# Patient Record
Sex: Female | Born: 1995 | Hispanic: Yes | Marital: Married | Smoking: Never smoker
Health system: Southern US, Community
[De-identification: ages and names within clinical notes are randomized; demographics above are authoritative.]

## PROBLEM LIST (undated history)

## (undated) DIAGNOSIS — D649 Anemia, unspecified: Secondary | ICD-10-CM

## (undated) DIAGNOSIS — E119 Type 2 diabetes mellitus without complications: Secondary | ICD-10-CM

## (undated) HISTORY — DX: Anemia, unspecified: D64.9

## (undated) HISTORY — PX: WISDOM TOOTH EXTRACTION: SHX21

## (undated) HISTORY — DX: Type 2 diabetes mellitus without complications: E11.9

## (undated) HISTORY — PX: SPINE SURGERY: SHX786

---
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
## 2014-08-12 DIAGNOSIS — M545 Low back pain: Secondary | ICD-10-CM | POA: Insufficient documentation

## 2014-08-12 DIAGNOSIS — Z720 Tobacco use: Secondary | ICD-10-CM | POA: Diagnosis not present

## 2014-08-12 DIAGNOSIS — R509 Fever, unspecified: Secondary | ICD-10-CM | POA: Insufficient documentation

## 2014-08-13 ENCOUNTER — Emergency Department (HOSPITAL_COMMUNITY)
Admission: EM | Admit: 2014-08-13 | Discharge: 2014-08-13 | Disposition: A | Discharge status: Home / Self Care | Payer: Medicaid Other | Attending: Emergency Medicine | Admitting: Emergency Medicine

## 2014-08-13 ENCOUNTER — Encounter (HOSPITAL_COMMUNITY): Payer: Self-pay | Admitting: Emergency Medicine

## 2014-08-13 DIAGNOSIS — M545 Low back pain, unspecified: Secondary | ICD-10-CM

## 2014-08-13 LAB — URINALYSIS, ROUTINE W REFLEX MICROSCOPIC
BILIRUBIN URINE: NEGATIVE
Glucose, UA: NEGATIVE mg/dL
Hgb urine dipstick: NEGATIVE
KETONES UR: NEGATIVE mg/dL
Leukocytes, UA: NEGATIVE
NITRITE: NEGATIVE
Protein, ur: NEGATIVE mg/dL
Specific Gravity, Urine: 1.014 (ref 1.005–1.030)
Urobilinogen, UA: 0.2 mg/dL (ref 0.0–1.0)
pH: 5.5 (ref 5.0–8.0)

## 2014-08-13 LAB — CBC
HEMATOCRIT: 35.4 % — AB (ref 36.0–46.0)
HEMOGLOBIN: 11.2 g/dL — AB (ref 12.0–15.0)
MCH: 24.9 pg — ABNORMAL LOW (ref 26.0–34.0)
MCHC: 31.6 g/dL (ref 30.0–36.0)
MCV: 78.8 fL (ref 78.0–100.0)
Platelets: 285 10*3/uL (ref 150–400)
RBC: 4.49 MIL/uL (ref 3.87–5.11)
RDW: 17.6 % — ABNORMAL HIGH (ref 11.5–15.5)
WBC: 10.2 10*3/uL (ref 4.0–10.5)

## 2014-08-13 MED ORDER — TRAMADOL HCL 50 MG PO TABS: 50.0000 mg | ORAL_TABLET | Freq: Four times a day (QID) | ORAL | Status: DC | PRN

## 2014-08-13 MED ORDER — CYCLOBENZAPRINE HCL 10 MG PO TABS
5.0000 mg | ORAL_TABLET | Freq: Once | ORAL | Status: AC
  Administered 2014-08-13: 5 mg via ORAL
  Filled 2014-08-13: qty 1

## 2014-08-13 MED ORDER — CYCLOBENZAPRINE HCL 5 MG PO TABS: 5.0000 mg | ORAL_TABLET | Freq: Three times a day (TID) | ORAL | Status: DC | PRN

## 2014-08-13 MED ORDER — TRAMADOL HCL 50 MG PO TABS
50.0000 mg | ORAL_TABLET | Freq: Once | ORAL | Status: AC
  Administered 2014-08-13: 50 mg via ORAL
  Filled 2014-08-13: qty 1

## 2014-08-13 NOTE — ED Notes (Signed)
Patient here with lower back pain which is worsened by sitting and laying down. This pain is accompanied by a headache. States history of migraines in the presence of anemia.

## 2014-08-13 NOTE — ED Provider Notes (Signed)
CSN: [id number]     Arrival date & time 08/12/14  2352 History   First MD Initiated Contact with Patient 08/13/14 0038     Chief Complaint  Patient presents with  . Headache  . Back Pain     (Consider location/radiation/quality/duration/timing/severity/associated sxs/prior Treatment) HPI Comments: Patient reports that she's been between jobs for the past 2 weeks, doing housework.  She presents with one week of lumbosacral pain that does not radiate to her legs.  There is no numbness or tingling.  No loss of bowel or bladder control.  Denies any dysuria, diarrhea, constipation.  She states she has taken Advil occasionally for her discomfort without relief  Patient is a 19 y.o. female presenting with back pain. The history is provided by the patient.  Back Pain Location:  Lumbar spine Quality:  Aching Radiates to:  Does not radiate Pain severity:  Mild Pain is:  Same all the time Timing:  Constant Progression:  Unchanged Chronicity:  New Relieved by:  Nothing Worsened by:  Sitting and lying down Ineffective treatments:  None tried Associated symptoms: fever   Associated symptoms: no abdominal pain, no dysuria, no headaches, no leg pain, no numbness, no paresthesias, no perianal numbness, no tingling and no weakness     History reviewed. No pertinent past medical history. History reviewed. No pertinent past surgical history. No family history on file. History  Substance Use Topics  . Smoking status: Current Some Day Smoker  . Smokeless tobacco: Not on file  . Alcohol Use: No   OB History    No data available     Review of Systems  Constitutional: Positive for fever.  Respiratory: Negative for cough and shortness of breath.   Gastrointestinal: Negative for nausea, vomiting, abdominal pain, diarrhea and constipation.  Genitourinary: Negative for dysuria, decreased urine volume, vaginal bleeding and vaginal discharge.  Musculoskeletal: Positive for back pain.  Skin:  Negative for rash.  Neurological: Negative for tingling, weakness, numbness, headaches and paresthesias.  All other systems reviewed and are negative.     Allergies  Review of patient's allergies indicates no known allergies.  Home Medications   Prior to Admission medications   Medication Sig Start Date End Date Taking? Authorizing Provider  cyclobenzaprine (FLEXERIL) 5 MG tablet Take 1 tablet (5 mg total) by mouth 3 (three) times daily as needed for muscle spasms. 08/13/14   Earley FavorGail Danniel Grenz, NP  traMADol (ULTRAM) 50 MG tablet Take 1 tablet (50 mg total) by mouth every 6 (six) hours as needed. 08/13/14   Earley FavorGail Ambur Province, NP   BP 121/79 mmHg  Pulse 92  Temp(Src) 98.3 F (36.8 C)  Resp 20  Wt 241 lb 3 oz (109.402 kg)  SpO2 100%  LMP 07/26/2014 (Exact Date) Physical Exam  Constitutional: She appears well-developed and well-nourished.  HENT:  Head: Normocephalic.  Neck: Normal range of motion.  Cardiovascular: Normal rate and regular rhythm.   Pulmonary/Chest: Effort normal.  Abdominal: Soft.  Musculoskeletal: Normal range of motion. She exhibits tenderness.       Back:  Neurological: She is alert.  Skin: Skin is warm.  Nursing note and vitals reviewed.   ED Course  Procedures (including critical care time) Labs Review Labs Reviewed  CBC - Abnormal; Notable for the following:    Hemoglobin 11.2 (*)    HCT 35.4 (*)    MCH 24.9 (*)    RDW 17.6 (*)    All other components within normal limits  URINALYSIS, ROUTINE W REFLEX MICROSCOPIC  Imaging Review No results found.   EKG Interpretation None     Urine was checked, is negative for any sign of infection.  She's been given a prescription for Ultram, and Flexeril to take and a regular basis.  She's also been given a set of exercises to help strengthen her lower back MDM   Final diagnoses:  Lumbosacral pain         Earley Favor, NP 08/13/14 0231  Derwood Kaplan, MD 08/13/14 [phone]

## 2014-08-13 NOTE — Discharge Instructions (Signed)
Back Exercises Back exercises help treat and prevent back injuries. The goal of back exercises is to increase the strength of your abdominal and back muscles and the flexibility of your back. These exercises should be started when you no longer have back pain. Back exercises include:  Pelvic Tilt. Lie on your back with your knees bent. Tilt your pelvis until the lower part of your back is against the floor. Hold this position 5 to 10 sec and repeat 5 to 10 times.  Knee to Chest. Pull first 1 knee up against your chest and hold for 20 to 30 seconds, repeat this with the other knee, and then both knees. This may be done with the other leg straight or bent, whichever feels better.  Sit-Ups or Curl-Ups. Bend your knees 90 degrees. Start with tilting your pelvis, and do a partial, slow sit-up, lifting your trunk only 30 to 45 degrees off the floor. Take at least 2 to 3 seconds for each sit-up. Do not do sit-ups with your knees out straight. If partial sit-ups are difficult, simply do the above but with only tightening your abdominal muscles and holding it as directed.  Hip-Lift. Lie on your back with your knees flexed 90 degrees. Push down with your feet and shoulders as you raise your hips a couple inches off the floor; hold for 10 seconds, repeat 5 to 10 times.  Back arches. Lie on your stomach, propping yourself up on bent elbows. Slowly press on your hands, causing an arch in your low back. Repeat 3 to 5 times. Any initial stiffness and discomfort should lessen with repetition over time.  Shoulder-Lifts. Lie face down with arms beside your body. Keep hips and torso pressed to floor as you slowly lift your head and shoulders off the floor. Do not overdo your exercises, especially in the beginning. Exercises may cause you some mild back discomfort which lasts for a few minutes; however, if the pain is more severe, or lasts for more than 15 minutes, do not continue exercises until you see your caregiver.  Improvement with exercise therapy for back problems is slow.  See your caregivers for assistance with developing a proper back exercise program. Document Released: 06/07/2004 Document Revised: 07/23/2011 Document Reviewed: 03/01/2011 [facility] LP[facility] Patient Information 2015 [place] [employer]. This information is not intended to replace advice given to you by your health care provider. Make sure you discuss any questions you have with your health care provider. Review urine is normal.  You have no other signs of infection.  You've been given medication for pain as well as muscle relaxers.  You've also been given a set of exercises that you can perform to help strengthen your back muscles

## 2015-12-12 ENCOUNTER — Other Ambulatory Visit: Payer: Self-pay | Admitting: Family Medicine

## 2015-12-12 ENCOUNTER — Ambulatory Visit (INDEPENDENT_AMBULATORY_CARE_PROVIDER_SITE_OTHER): Payer: Self-pay | Admitting: Family Medicine

## 2015-12-12 VITALS — BP 138/82 | HR 79 | Temp 98.1°F | Resp 17 | Ht 67.0 in | Wt 228.0 lb

## 2015-12-12 DIAGNOSIS — R197 Diarrhea, unspecified: Secondary | ICD-10-CM

## 2015-12-12 DIAGNOSIS — R109 Unspecified abdominal pain: Secondary | ICD-10-CM

## 2015-12-12 DIAGNOSIS — D649 Anemia, unspecified: Secondary | ICD-10-CM

## 2015-12-12 LAB — POCT URINALYSIS DIP (MANUAL ENTRY)
Blood, UA: NEGATIVE
GLUCOSE UA: NEGATIVE
LEUKOCYTES UA: NEGATIVE
NITRITE UA: NEGATIVE
Protein Ur, POC: 100 — AB
SPEC GRAV UA: 1.025
UROBILINOGEN UA: 0.2
pH, UA: 5.5

## 2015-12-12 LAB — POCT CBC
GRANULOCYTE PERCENT: 55.6 % (ref 37–80)
HEMATOCRIT: 34.8 % — AB (ref 37.7–47.9)
Hemoglobin: 11.8 g/dL — AB (ref 12.2–16.2)
Lymph, poc: 2.8 (ref 0.6–3.4)
MCH: 26.2 pg — AB (ref 27–31.2)
MCHC: 33.8 g/dL (ref 31.8–35.4)
MCV: 77.6 fL — AB (ref 80–97)
MID (cbc): 0.5 (ref 0–0.9)
MPV: 7.9 fL (ref 0–99.8)
PLATELET COUNT, POC: 286 10*3/uL (ref 142–424)
POC GRANULOCYTE: 4.2 (ref 2–6.9)
POC LYMPH %: 37.3 % (ref 10–50)
POC MID %: 7.1 %M (ref 0–12)
RBC: 4.48 M/uL (ref 4.04–5.48)
RDW, POC: 16.4 %
WBC: 7.5 10*3/uL (ref 4.6–10.2)

## 2015-12-12 LAB — POC MICROSCOPIC URINALYSIS (UMFC): Mucus: ABSENT

## 2015-12-12 LAB — POCT URINE PREGNANCY: PREG TEST UR: NEGATIVE

## 2015-12-12 MED ORDER — DIPHENOXYLATE-ATROPINE 2.5-0.025 MG PO TABS: 2.0000 | ORAL_TABLET | Freq: Four times a day (QID) | ORAL | 0 refills | Status: DC | PRN

## 2015-12-12 MED ORDER — FERROUS GLUCONATE 324 (38 FE) MG PO TABS: 324.0000 mg | ORAL_TABLET | Freq: Every day | ORAL | 3 refills | Status: DC

## 2015-12-12 MED ORDER — ONDANSETRON 8 MG PO TBDP: 8.0000 mg | ORAL_TABLET | Freq: Three times a day (TID) | ORAL | 0 refills | Status: DC | PRN

## 2015-12-12 NOTE — Patient Instructions (Addendum)
Will follow-up with lab results.  Begin taking daily iron if able tolerate daily with breakfast.  Zofran-take as needed for nausea.  Continue to force fluids to prevent dehydration.    IF you received an x-ray today, you will receive an invoice from [facility]. Please contact [facility] at [phone] with questions or concerns regarding your invoice.   IF you received labwork today, you will receive an invoice from [employer]. Please contact [facility] at [phone] with questions or concerns regarding your invoice.   Our billing staff will not be able to assist you with questions regarding bills from these companies.  You will be contacted with the lab results as soon as they are available. The fastest way to get your results is to activate your My Chart account. Instructions are located on the last page of this paperwork. If you have not heard from [place] regarding the results in 2 weeks, please contact this office.

## 2015-12-12 NOTE — Progress Notes (Signed)
Patient ID: Aimee [name], female    DOB: 1996-01-22, 20 y.o.   MRN: [id number]  PCP: No primary care provider on file.  Chief Complaint  Patient presents with  . Nausea  . Diarrhea  . Abdominal Pain    upper and lower  . Spasms    All started wed.     Subjective:   HPI Presents for evaluation of nausea and abdominal pain since last Wednesday, x 6 days. No recent known contributing exposures. Really strong abdominal pains and diarrhea. Tried over the counter anti-diarrheal medications without relief of symptoms. Felt some dizziness intermittently. Cramping experienced in legs and toes intermittently since onset of diarrhea.No fever or chills. Denies possibility of pregnancy. Admits to unprotected sex a few months ago. Stools occur almost immediately after completing a meal. She has been able to tolerate fluids. She was thinking a contributor to diarrhea could be cheese imported from [place] that she consumed last week. Does not recall a reaction from eating diary products in the past.  . Social History   Social History  . Marital status: Single    Spouse name: N/A  . Number of children: N/A  . Years of education: N/A   Occupational History  . Not on file.   Social History Main Topics  . Smoking status: Former Games developer  . Smokeless tobacco: Not on file  . Alcohol use No  . Drug use: Unknown  . Sexual activity: Yes    Birth control/ protection: None   Other Topics Concern  . Not on file   Social History Narrative  . No narrative on file    .No family history on file.  Review of Systems  Constitutional: Negative.   Respiratory: Negative.   Cardiovascular: Negative.   Gastrointestinal: Positive for abdominal pain and diarrhea. Negative for anal bleeding, blood in stool, constipation, nausea and vomiting.  Endocrine: Negative.   Skin: Negative.   Allergic/Immunologic: Negative.   Psychiatric/Behavioral: Negative.        There are no active  problems to display for this patient.    Prior to Admission medications   Medication Sig Start Date End Date Taking? Authorizing Provider  cyclobenzaprine (FLEXERIL) 5 MG tablet Take 1 tablet (5 mg total) by mouth 3 (three) times daily as needed for muscle spasms. Patient not taking: Reported on 12/12/2015 08/13/14   Earley Favor, NP  traMADol (ULTRAM) 50 MG tablet Take 1 tablet (50 mg total) by mouth every 6 (six) hours as needed. Patient not taking: Reported on 12/12/2015 08/13/14   Earley Favor, NP     No Known Allergies     Objective:  Physical Exam  Constitutional: She is oriented to person, place, and time. She appears well-developed and well-nourished.  HENT:  Head: Normocephalic and atraumatic.  Eyes: Conjunctivae and EOM are normal. Pupils are equal, round, and reactive to light.  Neck: Normal range of motion. Neck supple.  Cardiovascular: Normal rate, regular rhythm, normal heart sounds and intact distal pulses.   Pulmonary/Chest: Effort normal and breath sounds normal.  Abdominal: She exhibits no distension. There is tenderness. There is no rebound and no guarding.  Lower bilateral quadrant tenderness with deep palpation  Musculoskeletal: Normal range of motion.  Neurological: She is alert and oriented to person, place, and time.  Skin: Skin is warm and dry.  Psychiatric: She has a normal mood and affect. Her behavior is normal. Judgment and thought content normal.        Assessment & Plan:  .1. Abdominal  pain, unspecified abdominal location - POCT urine pregnancy - POCT Microscopic Urinalysis ([facility]) - POCT urinalysis dipstick - H. pylori antibody, IgG 2. Anemia, unspecified anemia type - POCT CBC 3. Diarrhea, unspecified type -Comprehensive metabolic panel - H. pylori antibody, IgG  Patient presents today with a complaint of abdominal pain with diarrhea times 5 days.  She has no known exposures, afebrile, and absent of nausea/vomiting.  H pylori testing pending.  Likely, viral gastritis.  Take Lomotil 2 tabs as needed for diarrhea.  Will follow-up with lab results. If conditions worsens or does not improve, return to office for follow-up.  Godfrey Pick. Tiburcio Pea, MSN, FNP-C [facility]

## 2015-12-13 LAB — COMPREHENSIVE METABOLIC PANEL
ALBUMIN: 4.2 g/dL (ref 3.6–5.1)
ALK PHOS: 82 U/L (ref 33–115)
ALT: 35 U/L — ABNORMAL HIGH (ref 6–29)
AST: 28 U/L (ref 10–30)
BUN: 8 mg/dL (ref 7–25)
CALCIUM: 9.3 mg/dL (ref 8.6–10.2)
CHLORIDE: 104 mmol/L (ref 98–110)
CO2: 27 mmol/L (ref 20–31)
Creat: 0.71 mg/dL (ref 0.50–1.10)
GLUCOSE: 87 mg/dL (ref 65–99)
Potassium: 3.9 mmol/L (ref 3.5–5.3)
SODIUM: 137 mmol/L (ref 135–146)
Total Bilirubin: 0.3 mg/dL (ref 0.2–1.2)
Total Protein: 7 g/dL (ref 6.1–8.1)

## 2015-12-16 LAB — H. PYLORI BREATH TEST: H. PYLORI BREATH TEST: NOT DETECTED

## 2015-12-21 ENCOUNTER — Encounter: Payer: Self-pay | Admitting: Family Medicine

## 2017-09-10 ENCOUNTER — Encounter: Payer: Self-pay | Admitting: Family Medicine

## 2017-09-10 ENCOUNTER — Ambulatory Visit (INDEPENDENT_AMBULATORY_CARE_PROVIDER_SITE_OTHER): Payer: BLUE CROSS/BLUE SHIELD | Admitting: Family Medicine

## 2017-09-10 ENCOUNTER — Other Ambulatory Visit: Payer: Self-pay

## 2017-09-10 VITALS — BP 128/80 | HR 94 | Temp 98.8°F | Ht 66.0 in | Wt 263.2 lb

## 2017-09-10 DIAGNOSIS — R5383 Other fatigue: Secondary | ICD-10-CM

## 2017-09-10 DIAGNOSIS — N926 Irregular menstruation, unspecified: Secondary | ICD-10-CM | POA: Diagnosis not present

## 2017-09-10 DIAGNOSIS — Z119 Encounter for screening for infectious and parasitic diseases, unspecified: Secondary | ICD-10-CM | POA: Diagnosis not present

## 2017-09-10 DIAGNOSIS — Z01419 Encounter for gynecological examination (general) (routine) without abnormal findings: Secondary | ICD-10-CM

## 2017-09-10 DIAGNOSIS — K219 Gastro-esophageal reflux disease without esophagitis: Secondary | ICD-10-CM

## 2017-09-10 DIAGNOSIS — R0602 Shortness of breath: Secondary | ICD-10-CM

## 2017-09-10 DIAGNOSIS — Z13228 Encounter for screening for other metabolic disorders: Secondary | ICD-10-CM

## 2017-09-10 DIAGNOSIS — R102 Pelvic and perineal pain: Secondary | ICD-10-CM | POA: Diagnosis not present

## 2017-09-10 DIAGNOSIS — G5603 Carpal tunnel syndrome, bilateral upper limbs: Secondary | ICD-10-CM

## 2017-09-10 LAB — POCT URINE PREGNANCY: Preg Test, Ur: NEGATIVE

## 2017-09-10 MED ORDER — OMEPRAZOLE 20 MG PO CPDR: 20.0000 mg | DELAYED_RELEASE_CAPSULE | Freq: Two times a day (BID) | ORAL | 0 refills | Status: DC

## 2017-09-10 NOTE — Progress Notes (Signed)
4/30/20193:29 PM  Aimee [name] [birth year], 22 y.o. female [id number]  Chief Complaint  Patient presents with  . Annual Exam    having swelling in the in feet and tingling in both hands, especially at night. Also, pain in the stomach area    HPI:   Patient is a 22 y.o. female who presents today for CPE with several concerns  Has not seen a doctor in several years Has not had a pap yet, she is 21yo LMP 08/01/17, then had spotting again on 08/15/17 Reports normally has regular cycles, normal flow, 4 days She denies any vaginal discharge, dysuria. Endorses left sided lower abd/pelvic pain for past several weeks Not on birth control, recently married, ok if pregnant Husband currently in [place], pending legal residency Patient returned recently for spending over 6 months in [place] Gained about 30 lbs in this last  year  1. Patient works as a Education administrator, having numbness/tingling of both hands, left worse than right. She is left-handed. No weakness, dropping of objects.  2. She is having worsening heartburn. Mostly at night. Takes tums prn, it helps. No epigastric pain. No nausea, vomiting, diarrhea or constipation  3. Has been having intermittent left ankle swelling, denies any trauma, seems to happen on days when she stands for long periods of time. She wakes up with ankle normal, improves with elevation.   4. Has been noticing intermittent SOB, feels chest tight, no wheezing, no palpitations, no chest pain. Seems to happen with significant exertion. Denies h/o asthma. Does not smoke.  Fall Risk  09/10/2017 12/12/2015  Falls in the past year? No No     Depression screen [facility] 2/9 09/10/2017 12/12/2015  Decreased Interest 0 0  Down, Depressed, Hopeless 0 0  PHQ - 2 Score 0 0    No Known Allergies  Prior to Admission medications   Not on File    History reviewed. No pertinent past medical history.  History reviewed. No pertinent surgical history.  Social History    Tobacco Use  . Smoking status: Former Games developer  . Smokeless tobacco: Never Used  Substance Use Topics  . Alcohol use: No    Family History  Problem Relation Age of Onset  . Healthy Mother   . Diabetes Father   . Diabetes Maternal Grandfather     Review of Systems  Constitutional: Positive for malaise/fatigue. Negative for chills and fever.  HENT: Negative for congestion, ear pain, hearing loss, sinus pain and sore throat.   Eyes: Negative for blurred vision and double vision.  Respiratory: Positive for shortness of breath. Negative for cough, sputum production and wheezing.   Cardiovascular: Negative for chest pain and palpitations.  Gastrointestinal: Positive for heartburn. Negative for abdominal pain, blood in stool, constipation, diarrhea, melena, nausea and vomiting.  Genitourinary: Negative for dysuria, frequency, hematuria and urgency.  Musculoskeletal: Negative for joint pain and myalgias.  Neurological: Positive for dizziness, tingling and headaches. Negative for focal weakness.  Endo/Heme/Allergies: Negative for polydipsia.  Psychiatric/Behavioral: Negative for depression. The patient is not nervous/anxious and does not have insomnia.     OBJECTIVE:  Blood pressure 128/80, pulse 94, temperature 98.8 F (37.1 C), temperature source Oral, height  (1.676 m), weight 263 lb 3.2 oz (119.4 kg), last menstrual period 08/01/2017, SpO2 98 %.  Physical Exam  Constitutional: She is oriented to person, place, and time. She appears well-developed and well-nourished.  HENT:  Head: Normocephalic and atraumatic.  Right Ear: Hearing, tympanic membrane, external ear and ear canal normal.  Left Ear: Hearing, tympanic membrane, external ear and ear canal normal.  Mouth/Throat: Oropharynx is clear and moist.  Eyes: Pupils are equal, round, and reactive to light. Conjunctivae and EOM are normal.  Neck: Neck supple. No thyromegaly present.  Cardiovascular: Normal rate, regular  rhythm, normal heart sounds and intact distal pulses. Exam reveals no gallop and no friction rub.  No murmur heard. Pulmonary/Chest: Effort normal and breath sounds normal. She has no wheezes. She has no rhonchi. She has no rales. Right breast exhibits no inverted nipple, no mass, no nipple discharge, no skin change and no tenderness. Left breast exhibits no inverted nipple, no mass, no nipple discharge, no skin change and no tenderness. Breasts are symmetrical.  Abdominal: Soft. Bowel sounds are normal. She exhibits no distension. There is no hepatosplenomegaly. There is no tenderness. There is no guarding.  Genitourinary: There is no rash or lesion on the right labia. There is no rash or lesion on the left labia. Uterus is not enlarged, not fixed and not tender. Cervix exhibits no motion tenderness, no discharge and no friability. Right adnexum displays no mass and no tenderness. Left adnexum displays tenderness. Left adnexum displays no mass and no fullness. No erythema in the vagina. No vaginal discharge found.  Musculoskeletal: Normal range of motion. She exhibits no edema.  Lymphadenopathy:    She has no cervical adenopathy.    She has no axillary adenopathy.       Right: No supraclavicular adenopathy present.       Left: No supraclavicular adenopathy present.  Neurological: She is alert and oriented to person, place, and time. She has normal strength and normal reflexes. No cranial nerve deficit or sensory deficit. She displays a negative Romberg sign. Coordination and gait normal.  + BUE phalen, postive LUE tinel, neg RUE tinel  Skin: Skin is warm and dry.  Psychiatric: She has a normal mood and affect.  Nursing note and vitals reviewed.  My interpretation of EKG:  NSR, HR 76, normal intervals, no st changes  Results for orders placed or performed in visit on 09/10/17 (from the past 24 hour(s))  POCT urine pregnancy     Status: Normal   Collection Time: 09/10/17  3:50 PM  Result Value  Ref Range   Preg Test, Ur Negative Negative    ASSESSMENT and PLAN  1. Women's annual routine gynecological examination Routine HCM labs ordered. HCM reviewed/discussed. Anticipatory guidance regarding healthy weight, lifestyle and choices given.  - Pap IG, CT/NG w/ reflex HPV when ASC-U  2. Missed period - POCT urine pregnancy  3. Screening for metabolic disorder - Hemoglobin A1c - Lipid panel - TSH - Comprehensive metabolic panel  4. Fatigue, unspecified type 6. SOB (shortness of breath) on exertion Discussed with patient that symptoms might be related to recent weight gain. Labs per below. Normal ekg, further workup at next visit. RTC precautions reviewed.  - CBC with Differential/Platelet - Hemoglobin A1c - TSH - Comprehensive metabolic panel - EKG 12-Lead  5. Bilateral carpal tunnel syndrome Discussed conservative measures, use of braces at night, OTC nsaids, ice. Patient educational handout given.  7. Gastroesophageal reflux disease without esophagitis Discussed recent weight gain as possible trigger, discussed dietary changes, small frequent meals, no late meals. Trial of omeprazole x 1 month. Patient educational handout given. RTC precautions reviewed. - omeprazole (PRILOSEC) 20 MG capsule; Take 1 capsule (20 mg total) by mouth 2 (two) times daily before a meal.  8. Pelvic pain in female Neg preg test. Pap  with gc/cg ordered today. [place] ordered for further evaluations.  - US PELVIC COMPLETE WITH TRANSVAGINAL; Future  9. Screening examination for infectious disease - HIV antibody (with reflex)   Return in about 1 month (around 10/08/2017).    Myles Lipps, MD Primary Care at [facility] [address] [place], [place] [address] Ph.  [phone] Fax [phone]

## 2017-09-10 NOTE — Patient Instructions (Addendum)
IF you received an x-ray today, you will receive an invoice from [facility]. Please contact [facility] at [phone] with questions or concerns regarding your invoice.   IF you received labwork today, you will receive an invoice from [facility]. Please contact [facility] at [phone] with questions or concerns regarding your invoice.   Our billing staff will not be able to assist you with questions regarding bills from these companies.  You will be contacted with the lab results as soon as they are available. The fastest way to get your results is to activate your My Chart account. Instructions are located on the last page of this paperwork. If you have not heard from [place] regarding the results in 2 weeks, please contact this office.     Food Choices for Gastroesophageal Reflux Disease, Adult When you have gastroesophageal reflux disease (GERD), the foods you eat and your eating habits are very important. Choosing the right foods can help ease your discomfort. What guidelines do I need to follow?  Choose fruits, vegetables, whole grains, and low-fat dairy products.  Choose low-fat meat, fish, and poultry.  Limit fats such as oils, salad dressings, butter, nuts, and avocado.  Keep a food diary. This helps you identify foods that cause symptoms.  Avoid foods that cause symptoms. These may be different for everyone.  Eat small meals often instead of 3 large meals a day.  Eat your meals slowly, in a place where you are relaxed.  Limit fried foods.  Cook foods using methods other than frying.  Avoid drinking alcohol.  Avoid drinking large amounts of liquids with your meals.  Avoid bending over or lying down until 2-3 hours after eating. What foods are not recommended? These are some foods and drinks that may make your symptoms worse: Vegetables Tomatoes. Tomato juice. Tomato and spaghetti sauce. Chili peppers. Onion and garlic.  Horseradish. Fruits Oranges, grapefruit, and lemon (fruit and juice). Meats High-fat meats, fish, and poultry. This includes hot dogs, ribs, ham, sausage, salami, and bacon. Dairy Whole milk and chocolate milk. Sour cream. Cream. Butter. Ice cream. Cream cheese. Drinks Coffee and tea. Bubbly (carbonated) drinks or energy drinks. Condiments Hot sauce. Barbecue sauce. Sweets/Desserts Chocolate and cocoa. Donuts. Peppermint and spearmint. Fats and Oils High-fat foods. This includes [place] fries and potato chips. Other Vinegar. Strong spices. This includes black pepper, white pepper, red pepper, cayenne, curry powder, cloves, ginger, and chili powder. The items listed above may not be a complete list of foods and drinks to avoid. Contact your dietitian for more information. This information is not intended to replace advice given to you by your health care provider. Make sure you discuss any questions you have with your health care provider. Document Released: 10/30/2011 Document Revised: 10/06/2015 Document Reviewed: 03/04/2013 Elsevier Interactive Patient Education  2017 Elsevier Inc.  Carpal Tunnel Syndrome Carpal tunnel syndrome is a condition that causes pain in your hand and arm. The carpal tunnel is a narrow area that is on the palm side of your wrist. Repeated wrist motion or certain diseases may cause swelling in the tunnel. This swelling can pinch the main nerve in the wrist (median nerve). Follow these instructions at home: If you have a splint:  Wear it as told by your doctor. Remove it only as told by your doctor.  Loosen the splint if your fingers: ? Become numb and tingle. ? Turn blue and cold.  Keep the splint clean and dry. General instructions  Take over-the-counter and prescription  medicines only as told by your doctor.  Rest your wrist from any activity that may be causing your pain. If needed, talk to your employer about changes that can be made in your work,  such as getting a wrist pad to use while typing.  If directed, apply ice to the painful area: ? Put ice in a plastic bag. ? Place a towel between your skin and the bag. ? Leave the ice on for 20 minutes, 2-3 times per day.  Keep all follow-up visits as told by your doctor. This is important.  Do any exercises as told by your doctor, physical therapist, or occupational therapist. Contact a doctor if:  You have new symptoms.  Medicine does not help your pain.  Your symptoms get worse. This information is not intended to replace advice given to you by your health care provider. Make sure you discuss any questions you have with your health care provider. Document Released: 04/19/2011 Document Revised: 10/06/2015 Document Reviewed: 09/15/2014 Elsevier Interactive Patient Education  [employer].

## 2017-09-11 LAB — CBC WITH DIFFERENTIAL/PLATELET
Basophils Absolute: 0 10*3/uL (ref 0.0–0.2)
Basos: 1 %
EOS (ABSOLUTE): 0.1 10*3/uL (ref 0.0–0.4)
Eos: 2 %
Hematocrit: 37.8 % (ref 34.0–46.6)
Hemoglobin: 12.6 g/dL (ref 11.1–15.9)
Immature Grans (Abs): 0 10*3/uL (ref 0.0–0.1)
Immature Granulocytes: 0 %
Lymphocytes Absolute: 2.6 10*3/uL (ref 0.7–3.1)
Lymphs: 32 %
MCH: 28.3 pg (ref 26.6–33.0)
MCHC: 33.3 g/dL (ref 31.5–35.7)
MCV: 85 fL (ref 79–97)
Monocytes Absolute: 0.7 10*3/uL (ref 0.1–0.9)
Monocytes: 8 %
Neutrophils Absolute: 4.7 10*3/uL (ref 1.4–7.0)
Neutrophils: 57 %
Platelets: 303 10*3/uL (ref 150–379)
RBC: 4.46 x10E6/uL (ref 3.77–5.28)
RDW: 14.6 % (ref 12.3–15.4)
WBC: 8.2 10*3/uL (ref 3.4–10.8)

## 2017-09-11 LAB — LIPID PANEL
Chol/HDL Ratio: 3.6 ratio (ref 0.0–4.4)
Cholesterol, Total: 133 mg/dL (ref 100–199)
HDL: 37 mg/dL — ABNORMAL LOW (ref >39)
LDL Calculated: 72 mg/dL (ref 0–99)
Triglycerides: 119 mg/dL (ref 0–149)
VLDL Cholesterol Cal: 24 mg/dL (ref 5–40)

## 2017-09-11 LAB — COMPREHENSIVE METABOLIC PANEL
ALT: 33 IU/L — ABNORMAL HIGH (ref 0–32)
AST: 25 IU/L (ref 0–40)
Albumin/Globulin Ratio: 1.6 (ref 1.2–2.2)
Albumin: 4.4 g/dL (ref 3.5–5.5)
Alkaline Phosphatase: 77 IU/L (ref 39–117)
BUN/Creatinine Ratio: 13 (ref 9–23)
BUN: 9 mg/dL (ref 6–20)
Bilirubin Total: 0.5 mg/dL (ref 0.0–1.2)
CO2: 25 mmol/L (ref 20–29)
Calcium: 9.5 mg/dL (ref 8.7–10.2)
Chloride: 101 mmol/L (ref 96–106)
Creatinine, Ser: 0.71 mg/dL (ref 0.57–1.00)
GFR calc Af Amer: 140 mL/min/{1.73_m2} (ref >59)
GFR calc non Af Amer: 121 mL/min/{1.73_m2} (ref >59)
Globulin, Total: 2.8 g/dL (ref 1.5–4.5)
Glucose: 89 mg/dL (ref 65–99)
Potassium: 4.5 mmol/L (ref 3.5–5.2)
Sodium: 138 mmol/L (ref 134–144)
Total Protein: 7.2 g/dL (ref 6.0–8.5)

## 2017-09-11 LAB — HEMOGLOBIN A1C
Est. average glucose Bld gHb Est-mCnc: 123 mg/dL
Hgb A1c MFr Bld: 5.9 % — ABNORMAL HIGH (ref 4.8–5.6)

## 2017-09-11 LAB — TSH: TSH: 1.6 u[IU]/mL (ref 0.450–4.500)

## 2017-09-11 LAB — HIV ANTIBODY (ROUTINE TESTING W REFLEX): HIV Screen 4th Generation wRfx: NONREACTIVE

## 2017-09-13 ENCOUNTER — Encounter: Payer: Self-pay | Admitting: Family Medicine

## 2017-09-13 DIAGNOSIS — R87612 Low grade squamous intraepithelial lesion on cytologic smear of cervix (LGSIL): Secondary | ICD-10-CM | POA: Insufficient documentation

## 2017-09-13 LAB — PAP IG, CT-NG, RFX HPV ASCU
Chlamydia, Nuc. Acid Amp: NEGATIVE
Gonococcus by Nucleic Acid Amp: NEGATIVE
PAP Smear Comment: 0

## 2017-10-10 ENCOUNTER — Ambulatory Visit (HOSPITAL_COMMUNITY)
Admission: RE | Admit: 2017-10-10 | Discharge: 2017-10-10 | Disposition: A | Discharge status: Home / Self Care | Payer: BLUE CROSS/BLUE SHIELD | Source: Ambulatory Visit | Attending: Family Medicine | Admitting: Family Medicine

## 2017-10-10 ENCOUNTER — Other Ambulatory Visit: Payer: Self-pay

## 2017-10-10 ENCOUNTER — Ambulatory Visit (INDEPENDENT_AMBULATORY_CARE_PROVIDER_SITE_OTHER): Payer: BLUE CROSS/BLUE SHIELD | Admitting: Family Medicine

## 2017-10-10 ENCOUNTER — Encounter: Payer: Self-pay | Admitting: Family Medicine

## 2017-10-10 ENCOUNTER — Other Ambulatory Visit: Payer: Self-pay | Admitting: Family Medicine

## 2017-10-10 VITALS — BP 124/86 | HR 81 | Temp 98.1°F | Ht 66.0 in | Wt 259.8 lb

## 2017-10-10 DIAGNOSIS — R935 Abnormal findings on diagnostic imaging of other abdominal regions, including retroperitoneum: Secondary | ICD-10-CM

## 2017-10-10 DIAGNOSIS — N920 Excessive and frequent menstruation with regular cycle: Secondary | ICD-10-CM | POA: Diagnosis not present

## 2017-10-10 DIAGNOSIS — R102 Pelvic and perineal pain: Secondary | ICD-10-CM

## 2017-10-10 DIAGNOSIS — R9389 Abnormal findings on diagnostic imaging of other specified body structures: Secondary | ICD-10-CM | POA: Insufficient documentation

## 2017-10-10 NOTE — Patient Instructions (Addendum)
  I have ordered either a referral or a diagnostic image. Please be mindful that it usually takes about 2 weeks to be called for an appointment. However if you have not be called for your appointment, please call us at [phone] and ask to speak with a referral clerk to further inquire about either your referral of diagnostic image.   IF you received an x-ray today, you will receive an invoice from [facility]. Please contact [facility] at [phone] with questions or concerns regarding your invoice.   IF you received labwork today, you will receive an invoice from [facility]. Please contact [facility] at [phone] with questions or concerns regarding your invoice.   Our billing staff will not be able to assist you with questions regarding bills from these companies.  You will be contacted with the lab results as soon as they are available. The fastest way to get your results is to activate your My Chart account. Instructions are located on the last page of this paperwork. If you have not heard from us regarding the results in 2 weeks, please contact this office.     

## 2017-10-10 NOTE — Progress Notes (Signed)
5/30/20195:02 PM  Aimee [name] 10/10/1995, 22 y.o. female [id number]  Chief Complaint  Patient presents with  . Follow-up    pelvic ultrasound was done this morning at [facility] today    HPI:   Patient is a 22 y.o. female who presents today for follow -up on pelvic US  [place] originally done for new onset LEFT pelvic pain At that time her period was late Since then her period came very heavy with big clots She has no acute concerns today  Fall Risk  10/10/2017 09/10/2017 12/12/2015  Falls in the past year? No No No     Depression screen [facility] 2/9 10/10/2017 09/10/2017 12/12/2015  Decreased Interest 0 0 0  Down, Depressed, Hopeless 0 0 0  PHQ - 2 Score 0 0 0    No Known Allergies  Prior to Admission medications   Medication Sig Start Date End Date Taking? Authorizing Provider  omeprazole (PRILOSEC) 20 MG capsule Take 1 capsule (20 mg total) by mouth 2 (two) times daily before a meal. 09/10/17   Myles Lipps, MD    History reviewed. No pertinent past medical history.  History reviewed. No pertinent surgical history.  Social History   Tobacco Use  . Smoking status: Former Games developer  . Smokeless tobacco: Never Used  Substance Use Topics  . Alcohol use: No    Family History  Problem Relation Age of Onset  . Healthy Mother   . Diabetes Father   . Diabetes Maternal Grandfather     ROS Per hpi  OBJECTIVE:  Blood pressure 124/86, pulse 81, temperature 98.1 F (36.7 C), temperature source Oral, height  (1.676 m), weight 259 lb 12.8 oz (117.8 kg), last menstrual period 09/25/2017, SpO2 98 %.  Physical Exam  Constitutional: She is oriented to person, place, and time.  HENT:  Head: Normocephalic and atraumatic.  Mouth/Throat: Mucous membranes are normal.  Eyes: Pupils are equal, round, and reactive to light. EOM are normal. No scleral icterus.  Neck: Neck supple.  Pulmonary/Chest: Effort normal.  Neurological: She is alert and oriented to person,  place, and time.  Skin: Skin is warm and dry.  Nursing note and vitals reviewed.  US Pelvic Complete With Transvaginal  Result Date: 10/10/2017 CLINICAL DATA:  Patient with pelvic pain for 1 month. EXAM: TRANSABDOMINAL AND TRANSVAGINAL ULTRASOUND OF PELVIS TECHNIQUE: Both transabdominal and transvaginal ultrasound examinations of the pelvis were performed. Transabdominal technique was performed for global imaging of the pelvis including uterus, ovaries, adnexal regions, and pelvic cul-de-sac. It was necessary to proceed with endovaginal exam following the transabdominal exam to visualize the endometrium and adnexal structures. COMPARISON:  None FINDINGS: Uterus Measurements: 8.2 x 4.4 x 5.5 cm. Retroverted. No fibroids or other mass visualized. Endometrium Thickness: 14 mm. Along the anterior right lateral aspect of the endometrium there is a 1.8 cm hyperechoic masslike structure. Right ovary Measurements: 3.3 x 2.2 x 2.0 cm. Normal appearance/no adnexal mass. Left ovary Measurements: 3.7 x 2.5 x 2.3 cm. Normal appearance/no adnexal mass. Other findings No abnormal free fluid. IMPRESSION: 1. There is a 1.8 cm hyperechoic masslike structure along the endometrium which is nonspecific in etiology however the possibility of an endometrial polyp/mass or focal endometrial hyperplasia are considerations. Recommend further evaluation with sonohysterogram for confirmation of possible endometrial mass. Electronically Signed   By: Annia Belt M.D.   On: 10/10/2017 09:42     ASSESSMENT and PLAN  1. Menorrhagia with regular cycle 2. Abnormal ultrasound of endometrium - Ambulatory referral  to Gynecology  Return if symptoms worsen or fail to improve.    Myles Lipps, MD Primary Care at [facility] [address] [place], [place] [address] Ph.  [phone] Fax [phone]

## 2017-10-21 ENCOUNTER — Ambulatory Visit: Payer: BLUE CROSS/BLUE SHIELD | Admitting: Women's Health

## 2017-10-21 ENCOUNTER — Encounter: Payer: Self-pay | Admitting: Women's Health

## 2017-10-21 VITALS — BP 118/80 | Ht 66.0 in | Wt 265.0 lb

## 2017-10-21 DIAGNOSIS — N840 Polyp of corpus uteri: Secondary | ICD-10-CM

## 2017-10-21 DIAGNOSIS — Z23 Encounter for immunization: Secondary | ICD-10-CM | POA: Diagnosis not present

## 2017-10-21 NOTE — Progress Notes (Signed)
22 year old M HF G0 presents from referral/follow-up for questionable endometrial polyp noted on ultrasound 10/14/2017 done for left-sided pelvic discomfort that has since resolved..  Regular monthly heavy flow 4 to 5-day cycles, one episode of intermenstrual spotting in April 2019.  Using no contraception desiring pregnancy.  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  09/2017 Pap showed LGSIL and was instructed to return in 1 year for Pap.  Has not had Gardasil.  Obese, states has had about a 30 pound weight gain in the past year due to overeating.  Exam: Appears well.  Heart regular rate and rhythm.  Abdomen obese, no rebound or radiation of pain.  Questionable endometrial polyp  Plan: Instructed to schedule sonohysterogram with Dr. Sudie BaileyLavoiel to rule out endometrial polyp after next cycle.  Safe pregnancy behaviors reviewed.  Instructed to continue multivitamin daily.  Adacel discussed.  First Gardasil given, instructed to return to office in 2 months for second, third in 6 months if has not returned to [place].  Reviewed importance of avoiding gardasil if chance of pregnancy.  Reviewed importance of increasing exercise and decreasing calorie/carbs for weight loss for healthier pregnancy.  Instructed to return to office for viability/dating ultrasound with missed cycle, aware we no longer deliver..Marland Kitchen

## 2017-10-21 NOTE — Patient Instructions (Signed)
Human Papillomavirus Quadrivalent Vaccine suspension for injection What is this medicine? HUMAN PAPILLOMAVIRUS VACCINE (HYOO muhn pap uh LOH muh vahy ruhs vak SEEN) is a vaccine. It is used to prevent infections of four types of the human papillomavirus. In women, the vaccine may lower your risk of getting cervical, vaginal, vulvar, or anal cancer and genital warts. In men, the vaccine may lower your risk of getting genital warts and anal cancer. You cannot get these diseases from the vaccine. This vaccine does not treat these diseases. This medicine may be used for other purposes; ask your health care provider or pharmacist if you have questions. COMMON BRAND NAME(S): Gardasil What should I tell my health care provider before I take this medicine? They need to know if you have any of these conditions: -fever or infection -hemophilia -HIV infection or AIDS -immune system problems -low platelet count -an unusual reaction to Human Papillomavirus Vaccine, yeast, other medicines, foods, dyes, or preservatives -pregnant or trying to get pregnant -breast-feeding How should I use this medicine? This vaccine is for injection in a muscle on your upper arm or thigh. It is given by a health care professional. [facility] Bast will be observed for 15 minutes after each dose. Sometimes, fainting happens after the vaccine is given. You may be asked to sit or lie down during the 15 minutes. Three doses are given. The second dose is given 2 months after the first dose. The last dose is given 4 months after the second dose. A copy of a Vaccine Information Statement will be given before each vaccination. Read this sheet carefully each time. The sheet may change frequently. Talk to your pediatrician regarding the use of this medicine in children. While this drug may be prescribed for children as young as 11 years of age for selected conditions, precautions do apply. Overdosage: If you think you have taken too much of this  medicine contact a poison control center or emergency room at once. NOTE: This medicine is only for you. Do not share this medicine with others. What if I miss a dose? All 3 doses of the vaccine should be given within 6 months. Remember to keep appointments for follow-up doses. Your health care provider will tell you when to return for the next vaccine. Ask your health care professional for advice if you are unable to keep an appointment or miss a scheduled dose. What may interact with this medicine? -other vaccines This list may not describe all possible interactions. Give your health care provider a list of all the medicines, herbs, non-prescription drugs, or dietary supplements you use. Also tell them if you smoke, drink alcohol, or use illegal drugs. Some items may interact with your medicine. What should I watch for while using this medicine? This vaccine may not fully protect everyone. Continue to have regular pelvic exams and cervical or anal cancer screenings as directed by your doctor. The Human Papillomavirus is a sexually transmitted disease. It can be passed by any kind of sexual activity that involves genital contact. The vaccine works best when given before you have any contact with the virus. Many people who have the virus do not have any signs or symptoms. Tell your doctor or health care professional if you have any reaction or unusual symptom after getting the vaccine. What side effects may I notice from receiving this medicine? Side effects that you should report to your doctor or health care professional as soon as possible: -allergic reactions like skin rash, itching or hives, swelling  of the face, lips, or tongue -breathing problems -feeling faint or lightheaded, falls Side effects that usually do not require medical attention (report to your doctor or health care professional if they continue or are bothersome): -cough -dizziness -fever -headache -nausea -redness, warmth,  swelling, pain, or itching at site where injected This list may not describe all possible side effects. Call your doctor for medical advice about side effects. You may report side effects to FDA at 1-800-FDA-1088. Where should I keep my medicine? This drug is given in a hospital or clinic and will not be stored at home. NOTE: This sheet is a summary. It may not cover all possible information. If you have questions about this medicine, talk to your doctor, pharmacist, or health care provider.  2018 Elsevier/Gold Standard (2013-06-22 13:14:33) Carbohydrate Counting for Diabetes Mellitus, Adult Carbohydrate counting is a method for keeping track of how many carbohydrates you eat. Eating carbohydrates naturally increases the amount of sugar (glucose) in the blood. Counting how many carbohydrates you eat helps keep your blood glucose within normal limits, which helps you manage your diabetes (diabetes mellitus). It is important to know how many carbohydrates you can safely have in each meal. This is different for every person. A diet and nutrition specialist (registered dietitian) can help you make a meal plan and calculate how many carbohydrates you should have at each meal and snack. Carbohydrates are found in the following foods:  Grains, such as breads and cereals.  Dried beans and soy products.  Starchy vegetables, such as potatoes, peas, and corn.  Fruit and fruit juices.  Milk and yogurt.  Sweets and snack foods, such as cake, cookies, candy, chips, and soft drinks.  How do I count carbohydrates? There are two ways to count carbohydrates in food. You can use either of the methods or a combination of both. Reading "Nutrition Facts" on packaged food The "Nutrition Facts" list is included on the labels of almost all packaged foods and beverages in the [place] It includes:  The serving size.  Information about nutrients in each serving, including the grams (g) of carbohydrate per  serving.  To use the "Nutrition Facts":  Decide how many servings you will have.  Multiply the number of servings by the number of carbohydrates per serving.  The resulting number is the total amount of carbohydrates that you will be having.  Learning standard serving sizes of other foods When you eat foods containing carbohydrates that are not packaged or do not include "Nutrition Facts" on the label, you need to measure the servings in order to count the amount of carbohydrates:  Measure the foods that you will eat with a food scale or measuring cup, if needed.  Decide how many standard-size servings you will eat.  Multiply the number of servings by 15. Most carbohydrate-rich foods have about 15 g of carbohydrates per serving. ? For example, if you eat 8 oz (170 g) of strawberries, you will have eaten 2 servings and 30 g of carbohydrates (2 servings x 15 g = 30 g).  For foods that have more than one food mixed, such as soups and casseroles, you must count the carbohydrates in each food that is included.  The following list contains standard serving sizes of common carbohydrate-rich foods. Each of these servings has about 15 g of carbohydrates:   hamburger bun or  English muffin.   oz (15 mL) syrup.   oz (14 g) jelly.  1 slice of bread.  1 six-inch tortilla.  3 oz (85 g) cooked rice or pasta.  4 oz (113 g) cooked dried beans.  4 oz (113 g) starchy vegetable, such as peas, corn, or potatoes.  4 oz (113 g) hot cereal.  4 oz (113 g) mashed potatoes or  of a large baked potato.  4 oz (113 g) canned or frozen fruit.  4 oz (120 mL) fruit juice.  4-6 crackers.  6 chicken nuggets.  6 oz (170 g) unsweetened dry cereal.  6 oz (170 g) plain fat-free yogurt or yogurt sweetened with artificial sweeteners.  8 oz (240 mL) milk.  8 oz (170 g) fresh fruit or one small piece of fruit.  24 oz (680 g) popped popcorn.  Example of carbohydrate counting Sample meal  3  oz (85 g) chicken breast.  6 oz (170 g) brown rice.  4 oz (113 g) corn.  8 oz (240 mL) milk.  8 oz (170 g) strawberries with sugar-free whipped topping. Carbohydrate calculation 1. Identify the foods that contain carbohydrates: ? Rice. ? Corn. ? Milk. ? Strawberries. 2. Calculate how many servings you have of each food: ? 2 servings rice. ? 1 serving corn. ? 1 serving milk. ? 1 serving strawberries. 3. Multiply each number of servings by 15 g: ? 2 servings rice x 15 g = 30 g. ? 1 serving corn x 15 g = 15 g. ? 1 serving milk x 15 g = 15 g. ? 1 serving strawberries x 15 g = 15 g. 4. Add together all of the amounts to find the total grams of carbohydrates eaten: ? 30 g + 15 g + 15 g + 15 g = 75 g of carbohydrates total. This information is not intended to replace advice given to you by your health care provider. Make sure you discuss any questions you have with your health care provider. Document Released: 04/30/2005 Document Revised: 11/18/2015 Document Reviewed: 10/12/2015 Elsevier Interactive Patient Education  [employer].

## 2017-10-23 ENCOUNTER — Other Ambulatory Visit: Payer: Self-pay | Admitting: Obstetrics & Gynecology

## 2017-10-23 DIAGNOSIS — R9389 Abnormal findings on diagnostic imaging of other specified body structures: Secondary | ICD-10-CM

## 2017-11-01 ENCOUNTER — Other Ambulatory Visit: Payer: Self-pay

## 2017-11-01 ENCOUNTER — Ambulatory Visit: Payer: BLUE CROSS/BLUE SHIELD | Admitting: Family Medicine

## 2017-11-01 ENCOUNTER — Encounter: Payer: Self-pay | Admitting: Family Medicine

## 2017-11-01 VITALS — BP 102/72 | HR 93 | Temp 98.5°F | Ht 66.0 in | Wt 261.4 lb

## 2017-11-01 DIAGNOSIS — M5442 Lumbago with sciatica, left side: Secondary | ICD-10-CM

## 2017-11-01 MED ORDER — OMEPRAZOLE 20 MG PO CPDR: 20.0000 mg | DELAYED_RELEASE_CAPSULE | Freq: Two times a day (BID) | ORAL | 0 refills | Status: DC

## 2017-11-01 MED ORDER — DICLOFENAC SODIUM 75 MG PO TBEC: 75.0000 mg | DELAYED_RELEASE_TABLET | Freq: Two times a day (BID) | ORAL | 0 refills | Status: DC

## 2017-11-01 MED ORDER — GABAPENTIN 300 MG PO CAPS: 300.0000 mg | ORAL_CAPSULE | Freq: Three times a day (TID) | ORAL | 3 refills | Status: DC

## 2017-11-01 MED ORDER — TRAMADOL HCL 50 MG PO TABS: 50.0000 mg | ORAL_TABLET | Freq: Three times a day (TID) | ORAL | 0 refills | Status: DC | PRN

## 2017-11-01 NOTE — Progress Notes (Signed)
6/21/20195:50 PM  Aimee [name] January 02, 1996, 22 y.o. female [id number]  Chief Complaint  Patient presents with  . Leg Pain    left leg pain, in therapy however not getting pain medication. Needs medication that will help with the pain. Taking otc tylenol with no reslove    HPI:   Patient is a 22 y.o. female with past medical history significant for right sided sciatica who presents today for flare-up  She reports having issues with sciatica for the past 4 years She reports most recent flareup started 3 weeks ago She reports midback pain with sharp stabbing pain down her leg to her foot She has started seeing a chiropractor, she is going 3 x week for 6 weeks They have done cold and electrical therapy, they will be start heat therapy next week She states the mid back pain is gone but not the pain that goes down her leg She has been taking tylenol and naproxen without relief She denies any changes in bowel or bladder function She denies any focal weakness, falls She is requesting for further treatment  Fall Risk  11/01/2017 10/10/2017 09/10/2017 12/12/2015  Falls in the past year? No No No No     [facility] 2[facility] 11/01/2017 10/10/2017 09/10/2017  Decreased Interest 0 0 0  Down, Depressed, Hopeless 0 0 0  PHQ - 2 Score 0 0 0    No Known Allergies  Prior to Admission medications   Medication Sig Start Date End Date Taking? Authorizing Provider    History reviewed. No pertinent past medical history.  Past Surgical History:  Procedure Laterality Date  . WISDOM TOOTH EXTRACTION      Social History   Tobacco Use  . Smoking status: Never Smoker  . Smokeless tobacco: Never Used  Substance Use Topics  . Alcohol use: Yes    Family History  Problem Relation Age of Onset  . Healthy Mother   . Diabetes Father   . Diabetes Maternal Grandfather     ROS Per hpi  OBJECTIVE:  Blood pressure 102/72, pulse 93, temperature 98.5 F (36.9 C), temperature  source Oral, height 5\' 6"  (1.676 m), weight 261 lb 6.4 oz (118.6 kg), last menstrual period 10/30/2017, SpO2 94 %.  Physical Exam  Constitutional: She appears well-developed and well-nourished.  HENT:  Head: Normocephalic and atraumatic.  Eyes: Pupils are equal, round, and reactive to light. Conjunctivae and EOM are normal.  Neck: Neck supple.  Musculoskeletal:       Lumbar back: She exhibits no tenderness, no bony tenderness and no spasm.  Neurological: She has normal strength. She displays normal reflexes. Gait (favoring affected leg) abnormal.  Positive right SLR and cross leg test  Skin: Skin is warm and dry.  Psychiatric: She has a normal mood and affect.  Nursing note and vitals reviewed.    ASSESSMENT and PLAN  1. Acute midline low back pain with left-sided sciatica  Continue with chiropractic care. New meds r/se/b reviewed. Start omeprazole will taking diclofenac. RTC precautions reviewed. - omeprazole (PRILOSEC) 20 MG capsule; Take 1 capsule (20 mg total) by mouth 2 (two) times daily before a meal. - diclofenac (VOLTAREN) 75 MG EC tablet; Take 1 tablet (75 mg total) by mouth 2 (two) times daily. - gabapentin (NEURONTIN) 300 MG capsule; Take 1 capsule (300 mg total) by mouth 3 (three) times daily. - traMADol (ULTRAM) 50 MG tablet; Take 1 tablet (50 mg total) by mouth every 8 (eight) hours as needed.  Return if symptoms worsen  or fail to improve.    Rutherford Guys, MD Primary Care at [facility] [place], [place] [address] Ph.  [phone] Fax [phone]

## 2017-11-01 NOTE — Patient Instructions (Signed)
     IF you received an x-ray today, you will receive an invoice from [facility]. Please contact  Radiology at [phone] with questions or concerns regarding your invoice.   IF you received labwork today, you will receive an invoice from [facility]. Please contact [facility] at [phone] with questions or concerns regarding your invoice.   Our billing staff will not be able to assist you with questions regarding bills from these companies.  You will be contacted with the lab results as soon as they are available. The fastest way to get your results is to activate your My Chart account. Instructions are located on the last page of this paperwork. If you have not heard from us regarding the results in 2 weeks, please contact this office.     

## 2017-11-06 ENCOUNTER — Ambulatory Visit (INDEPENDENT_AMBULATORY_CARE_PROVIDER_SITE_OTHER): Payer: BLUE CROSS/BLUE SHIELD | Admitting: Obstetrics & Gynecology

## 2017-11-06 ENCOUNTER — Other Ambulatory Visit: Payer: Self-pay | Admitting: Obstetrics & Gynecology

## 2017-11-06 ENCOUNTER — Ambulatory Visit (INDEPENDENT_AMBULATORY_CARE_PROVIDER_SITE_OTHER): Payer: BLUE CROSS/BLUE SHIELD

## 2017-11-06 DIAGNOSIS — R9389 Abnormal findings on diagnostic imaging of other specified body structures: Secondary | ICD-10-CM

## 2017-11-06 DIAGNOSIS — E282 Polycystic ovarian syndrome: Secondary | ICD-10-CM

## 2017-11-06 DIAGNOSIS — N840 Polyp of corpus uteri: Secondary | ICD-10-CM

## 2017-11-06 DIAGNOSIS — Z8742 Personal history of other diseases of the female genital tract: Secondary | ICD-10-CM

## 2017-11-06 NOTE — Progress Notes (Signed)
    Aimee [name] 1995-05-25 [id number]        22 y.o.  G0P0000 married  RP: Possible Endometrial Polyp on Pelvic US 10/14/2017 for Sonohysterogram  HPI: Not on any contraception as she would like to conceive.  Husband is [place], currently in [place].  No recent sexual activity.  Had a pelvic ultrasound for left pelvic pain which has now resolved.  On the pelvic ultrasound there was a possible endometrial polyp.  Patient has regular menstrual periods every month.  Had breakthrough bleeding in April 2019, none since then.   OB History  Gravida Para Term Preterm AB Living  0 0 0 0 0 0  SAB TAB Ectopic Multiple Live Births  0 0 0 0 0    Past medical history,surgical history, problem list, medications, allergies, family history and social history were all reviewed and documented in the EPIC chart.   Directed ROS with pertinent positives and negatives documented in the history of present illness/assessment and plan.  Exam:  There were no vitals filed for this visit. General appearance:  Normal                                                                    Sono Infusion Hysterogram ( procedure note)   The initial transvaginal ultrasound demonstrated the following: T/V images.  Uterus anteverted homogeneous measuring 6.73 x 5.69 x 4.39 cm.  Endometrial line measured at 4.9 mm.  Right ovarian echogenic focus measuring 6 x 4 mm with negative color flow Doppler.  Left ovary normal echo with numerous tiny follicles.  Mild increased ovarian volume bilaterally at more than 10 cc.  No apparent mass in the right or left adnexa.  No free fluid in the posterior cul-de-sac.  The speculum  was inserted and the cervix cleansed with Betadine solution after confirming that patient has no allergies.A small sonohysterography catheterwas utilized.  Insertion was facilitated with ring forceps, using a spear-like motion the catheter was inserted to the fundus of the uterus. The speculum is then  removed carefully to avoid dislodging the catheter. The catheter was flushed with sterile saline delete prior to insertion to rid it of small amounts of air.the sterile saline solution was infused into the uterine cavity as a vaginal ultrasound probe was then placed in the vagina for full visualization of the uterine cavity from a transvaginal approach. The following was noted: Following injection of saline the endometrium is filled with no defect seen.  The catheter was then removed after retrieving some of the saline from the intrauterine cavity. An endometrial biopsy was not done. Patient tolerated procedure well. She had received a tablet of Aleve for discomfort.    Assessment/Plan:  22 y.o. G0  1. Thickened endometrium Normal endometrial walls, no endometrial polyp on Sonohystero.  Procedure well-tolerated with no complication.  Patient reassured.  2. H/O metrorrhagia Isolated episode 08/2017.  Will observe.  Counseling on above issues and coordination of care more than 50% for 10 minutes.  Genia DelMarie-Lyne Rin Gorton MD, 11:30 AM 11/06/2017

## 2017-11-10 ENCOUNTER — Encounter: Payer: Self-pay | Admitting: Obstetrics & Gynecology

## 2017-11-10 NOTE — Patient Instructions (Signed)
1. Thickened endometrium Normal endometrial walls, no endometrial polyp on Sonohystero.  Procedure well-tolerated with no complication.  Patient reassured.  2. H/O metrorrhagia Isolated episode 08/2017.  Will observe.  Makyla, fue un placer encontrarle hoy!

## 2017-11-21 ENCOUNTER — Ambulatory Visit: Payer: BLUE CROSS/BLUE SHIELD | Admitting: Physician Assistant

## 2017-11-21 ENCOUNTER — Other Ambulatory Visit: Payer: Self-pay | Admitting: Family Medicine

## 2017-11-21 NOTE — Telephone Encounter (Signed)
Dicofenac refill Last OV:11/01/17; upcoming 11/22/17 Last refill:11/01/17 30 tab/0 refill EAV:[id number]:[facility] [address] - [place], [place] - [address] [phone] (Phone) [phone] (Fax)

## 2017-11-22 ENCOUNTER — Ambulatory Visit: Payer: BLUE CROSS/BLUE SHIELD | Admitting: Physician Assistant

## 2017-11-22 ENCOUNTER — Other Ambulatory Visit: Payer: Self-pay

## 2017-11-22 ENCOUNTER — Encounter: Payer: Self-pay | Admitting: Physician Assistant

## 2017-11-22 VITALS — BP 118/76 | HR 75 | Temp 98.4°F | Resp 20 | Ht 66.34 in | Wt 263.0 lb

## 2017-11-22 DIAGNOSIS — M5442 Lumbago with sciatica, left side: Secondary | ICD-10-CM

## 2017-11-22 MED ORDER — PREDNISONE 20 MG PO TABS: ORAL_TABLET | ORAL | 0 refills | Status: DC

## 2017-11-22 NOTE — Progress Notes (Signed)
Aimee [name]  MRN: [id number] DOB: [birth year]  Subjective:  Aimee [name] is a 22 y.o. female seen in office today for a chief complaint of follow-up on back pain.  Patient was initially seen by Dr. Leretha PolSantiago on 11/01/17 for left-sided sciatica.  Patient has been dealing with this for over 4 years.  Notes it is not really her back but her gluteal area and has stabbing sharp pain down left leg.  Has flareups a few times per year.  This particular flareup has been going on for about 3 months.  Has seen a chiropractor and had electrical stimulation, which did not help.  Dr. Leretha PolSantiago prescribed tramadol and gabapentin.  Notes the gabapentin helps if she takes 900 mg at one time.  Tramadol does not help it upsets her stomach.  She is not currently stretching.  Denies acute injury.  Denies saddle anesthesia, bladder/bowel incontinence, numbness, tingling, hematuria, urinary frequency, dysuria, leg pain, nausea, vomiting.  No she did have steroid injection while in [place] 5 months ago and that helped a whole lot for her flareup.  No past medical history of diabetes.  Last A1c was 5.9 two months ago. No other questions or concerns today   Review of Systems  Constitutional: Negative for chills, diaphoresis and fever.    Patient Active Problem List   Diagnosis Date Noted  . 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 diclofenac (VOLTAREN) 75 MG EC tablet Take 1 tablet (75 mg total) by mouth 2 (two) times daily. 30 tablet 0  . gabapentin (NEURONTIN) 300 MG capsule Take 1 capsule (300 mg total) by mouth 3 (three) times daily. 90 capsule 3  . omeprazole (PRILOSEC) 20 MG capsule Take 1 capsule (20 mg total) by mouth 2 (two) times daily before a meal. 60 capsule 0  . traMADol (ULTRAM) 50 MG tablet Take 1 tablet (50 mg total) by mouth every 8 (eight)  hours as needed. 30 tablet 0   No current facility-administered medications on file prior to visit.     No Known Allergies    Social History   Socioeconomic History  . Marital status: Married    Spouse name: Not on file  . Number of children: 0  . Years of education: Not on file  . Highest education level: Not on file  Occupational History  . Not on file  Social Needs  . Financial resource strain: Not on file  . Food insecurity:    Worry: Not on file    Inability: Not on file  . Transportation needs:    Medical: Not on file    Non-medical: Not on file  Tobacco Use  . Smoking status: Never Smoker  . Smokeless tobacco: Never Used  Substance and Sexual Activity  . Alcohol use: Yes  . Drug use: Never  . Sexual activity: Yes    Birth control/protection: None    Comment: intercourse age 22, less than 5 sexual partners  Lifestyle  . Physical activity:    Days per week: Not on file    Minutes per session: Not on file  . Stress: Not on file  Relationships  . Social connections:    Talks on phone: Not on file    Gets together: Not on file    Attends religious service: Not on file    Active member of club or organization: Not on file  Attends meetings of clubs or organizations: Not on file    Relationship status: Not on file  . Intimate partner violence:    Fear of current or ex partner: Not on file    Emotionally abused: Not on file    Physically abused: Not on file    Forced sexual activity: Not on file  Other Topics Concern  . Not on file  Social History Narrative  . Not on file    Objective:  BP 118/76 (BP Location: Left Arm, Patient Position: Sitting, Cuff Size: Large)   Pulse 75   Temp 98.4 F (36.9 C) (Oral)   Resp 20   Ht 5' 6.34" (1.685 m)   Wt 263 lb (119.3 kg)   LMP 10/30/2017   SpO2 97%   BMI 42.02 kg/m   Physical Exam  Constitutional: She is oriented to person, place, and time. She appears well-developed and well-nourished. No distress.    HENT:  Head: Normocephalic and atraumatic.  Eyes: Conjunctivae are normal.  Neck: Normal range of motion.  Pulmonary/Chest: Effort normal.  Musculoskeletal:       Thoracic back: Normal.       Lumbar back: She exhibits normal range of motion, no tenderness, no bony tenderness and no spasm.       Legs: Neurological: She is alert and oriented to person, place, and time. Gait normal.  Reflex Scores:      Patellar reflexes are 2+ on the right side and 2+ on the left side.      Achilles reflexes are 2+ on the right side and 2+ on the left side. +SLR of left leg, -SLR of right leg.  Sensation of BLE intact. Strength of BLE 5/5.  Skin: Skin is warm and dry.  Psychiatric: She has a normal mood and affect.  Vitals reviewed.   Assessment and Plan :  1. Acute midline low back pain with left-sided sciatica Due to persistent symptoms,  recommend steroid taper at this time. Can continue gabapentin. D/c tramadol. D/c OTC or Rx NSAIDs while on prednisone.  Also strongly encouraged to perform daily stretches.  Given educational material on sciatica sports rehab stretches. Encouraged patient to follow-up if no improvement with current medication regimen.May warrant lumbar imaging and possible referral to physical therapy or orthopedics at that time if no improvement. - predniSONE (DELTASONE) 20 MG tablet; Take 3 PO QAM x3days, 2 PO QAM x3days, 1 PO QAM x3days  Dispense: 18 tablet; Refill: 0    Benjiman Core PA-C  [facility] at [facility] 11/22/2017 8:50 AM

## 2017-11-22 NOTE — Patient Instructions (Addendum)
We are going to treat your underlying inflammation with oral prednisone. Prednisone is a steroid and can cause side effects such as headache, irritability, nausea, vomiting, increased heart rate, increased blood pressure, increased blood sugar, appetite changes, and insomnia. Please take tablets in the morning with a full meal to help decrease the chances of these side effects.   You can continue to use gabapentin with this medication but do not use over the counter antiinflammatories such as ibuprofen or advil with this medication.  Return to clinic if symptoms worsen, do not improve, or as needed. If no improvement, you may need additional imaging and referral to orthopedics.     Sciatica Rehab Ask your health care provider which exercises are safe for you. Do exercises exactly as told by your health care provider and adjust them as directed. It is normal to feel mild stretching, pulling, tightness, or discomfort as you do these exercises, but you should stop right away if you feel sudden pain or your pain gets worse.Do not begin these exercises until told by your health care provider. Stretching and range of motion exercises These exercises warm up your muscles and joints and improve the movement and flexibility of your hips and your back. These exercises also help to relieve pain, numbness, and tingling. Exercise A: Sciatic nerve glide 1. Sit in a chair with your head facing down toward your chest. Place your hands behind your back. Let your shoulders slump forward. 2. Slowly straighten one of your knees while you tilt your head back as if you are looking toward the ceiling. Only straighten your leg as far as you can without making your symptoms worse. 3. Hold for __________ seconds. 4. Slowly return to the starting position. 5. Repeat with your other leg. Repeat __________ times. Complete this exercise __________ times a day. Exercise B: Knee to chest with hip adduction and internal  rotation  1. Lie on your back on a firm surface with both legs straight. 2. Bend one of your knees and move it up toward your chest until you feel a gentle stretch in your lower back and buttock. Then, move your knee toward the shoulder that is on the opposite side from your leg. ? Hold your leg in this position by holding onto the front of your knee. 3. Hold for __________ seconds. 4. Slowly return to the starting position. 5. Repeat with your other leg. Repeat __________ times. Complete this exercise __________ times a day. Exercise C: Prone extension on elbows  1. Lie on your abdomen on a firm surface. A bed may be too soft for this exercise. 2. Prop yourself up on your elbows. 3. Use your arms to help lift your chest up until you feel a gentle stretch in your abdomen and your lower back. ? This will place some of your body weight on your elbows. If this is uncomfortable, try stacking pillows under your chest. ? Your hips should stay down, against the surface that you are lying on. Keep your hip and back muscles relaxed. 4. Hold for __________ seconds. 5. Slowly relax your upper body and return to the starting position. Repeat __________ times. Complete this exercise __________ times a day. Strengthening exercises These exercises build strength and endurance in your back. Endurance is the ability to use your muscles for a long time, even after they get tired. Exercise D: Pelvic tilt 1. Lie on your back on a firm surface. Bend your knees and keep your feet flat. 2. Tense your  abdominal muscles. Tip your pelvis up toward the ceiling and flatten your lower back into the floor. ? To help with this exercise, you may place a small towel under your lower back and try to push your back into the towel. 3. Hold for __________ seconds. 4. Let your muscles relax completely before you repeat this exercise. Repeat __________ times. Complete this exercise __________ times a day. Exercise E:  Alternating arm and leg raises  1. Get on your hands and knees on a firm surface. If you are on a hard floor, you may want to use padding to cushion your knees, such as an exercise mat. 2. Line up your arms and legs. Your hands should be below your shoulders, and your knees should be below your hips. 3. Lift your left leg behind you. At the same time, raise your right arm and straighten it in front of you. ? Do not lift your leg higher than your hip. ? Do not lift your arm higher than your shoulder. ? Keep your abdominal and back muscles tight. ? Keep your hips facing the ground. ? Do not arch your back. ? Keep your balance carefully, and do not hold your breath. 4. Hold for __________ seconds. 5. Slowly return to the starting position and repeat with your right leg and your left arm. Repeat __________ times. Complete this exercise __________ times a day. Posture and body mechanics  Body mechanics refers to the movements and positions of your body while you do your daily activities. Posture is part of body mechanics. Good posture and healthy body mechanics can help to relieve stress in your body's tissues and joints. Good posture means that your spine is in its natural S-curve position (your spine is neutral), your shoulders are pulled back slightly, and your head is not tipped forward. The following are general guidelines for applying improved posture and body mechanics to your everyday activities. Standing   When standing, keep your spine neutral and your feet about hip-width apart. Keep a slight bend in your knees. Your ears, shoulders, and hips should line up.  When you do a task in which you stand in one place for a long time, place one foot up on a stable object that is 2-4 inches (5-10 cm) high, such as a footstool. This helps keep your spine neutral. Sitting   When sitting, keep your spine neutral and keep your feet flat on the floor. Use a footrest, if necessary, and keep your  thighs parallel to the floor. Avoid rounding your shoulders, and avoid tilting your head forward.  When working at a desk or a computer, keep your desk at a height where your hands are slightly lower than your elbows. Slide your chair under your desk so you are close enough to maintain good posture.  When working at a computer, place your monitor at a height where you are looking straight ahead and you do not have to tilt your head forward or downward to look at the screen. Resting   When lying down and resting, avoid positions that are most painful for you.  If you have pain with activities such as sitting, bending, stooping, or squatting (flexion-based activities), lie in a position in which your body does not bend very much. For example, avoid curling up on your side with your arms and knees near your chest (fetal position).  If you have pain with activities such as standing for a long time or reaching with your arms (extension-based activities), lie  with your spine in a neutral position and bend your knees slightly. Try the following positions: ? Lying on your side with a pillow between your knees. ? Lying on your back with a pillow under your knees. Lifting   When lifting objects, keep your feet at least shoulder-width apart and tighten your abdominal muscles.  Bend your knees and hips and keep your spine neutral. It is important to lift using the strength of your legs, not your back. Do not lock your knees straight out.  Always ask for help to lift heavy or awkward objects. This information is not intended to replace advice given to you by your health care provider. Make sure you discuss any questions you have with your health care provider. Document Released: 04/30/2005 Document Revised: 01/05/2016 Document Reviewed: 01/14/2015 Elsevier Interactive Patient Education  [employer].

## 2017-12-23 ENCOUNTER — Ambulatory Visit: Payer: BLUE CROSS/BLUE SHIELD

## 2018-02-10 ENCOUNTER — Ambulatory Visit (INDEPENDENT_AMBULATORY_CARE_PROVIDER_SITE_OTHER): Payer: BLUE CROSS/BLUE SHIELD | Admitting: Orthopedic Surgery

## 2018-02-10 ENCOUNTER — Encounter (INDEPENDENT_AMBULATORY_CARE_PROVIDER_SITE_OTHER): Payer: Self-pay | Admitting: Orthopedic Surgery

## 2018-02-10 VITALS — Ht 66.34 in | Wt 263.0 lb

## 2018-02-10 DIAGNOSIS — L600 Ingrowing nail: Secondary | ICD-10-CM | POA: Diagnosis not present

## 2018-02-10 DIAGNOSIS — L03031 Cellulitis of right toe: Secondary | ICD-10-CM

## 2018-02-10 NOTE — Progress Notes (Signed)
   Office Visit Note   Patient: Aimee [name]           Date of Birth: [birth year]           MRN: [id number] Visit Date: 02/10/2018              Requested by: Myles Lipps, MD [address] [place], [place] [address] PCP: Myles Lipps, MD  Chief Complaint  Patient presents with  . Right Foot - Pain    Right Great Toe      HPI: Patient is a 22 year old woman who complains of a painful paronychial infection right great toenail.  Assessment & Plan: Visit Diagnoses:  1. Paronychia of great toe of right foot   2. Ingrown nail of great toe of right foot     Plan: Partial nail excision postoperative shoe dry dressing she will start with dressing change tomorrow with Dial soap cleansing antibiotic ointment and a Band-Aid.  Follow-Up Instructions: Return if symptoms worsen or fail to improve.   Ortho Exam  Patient is alert, oriented, no adenopathy, well-dressed, normal affect, normal respiratory effort. Examination patient has good pulses.  She has a paronychial infection of the lateral border of the right great toenail this is painful to palpation along the nail border.  She has a ingrown toenail.  There is no ascending cellulitis.  She has good pulses.  After informed consent and sterile prep with Betadine patient underwent a digital block with 10 cc 1% lidocaine plain after adequate levels of anesthesia were obtained patient had excision of the lateral border of the right great toenail the paronychial infection was decompressed there was good healthy granulation tissue this was covered with Xeroform dressing and a compressive dressing with a postoperative shoe.  Imaging: No results found. No images are attached to the encounter.  Labs: Lab Results  Component Value Date   HGBA1C 5.9 (H) 09/10/2017     Lab Results  Component Value Date   ALBUMIN 4.4 09/10/2017   ALBUMIN 4.2 12/12/2015    Body mass index is 42.02 kg/m.  Orders:  No orders of the defined  types were placed in this encounter.  No orders of the defined types were placed in this encounter.    Procedures: No procedures performed  Clinical Data: No additional findings.  ROS:  All other systems negative, except as noted in the HPI. Review of Systems  Objective: Vital Signs: Ht 5' 6.34" (1.685 m)   Wt 263 lb (119.3 kg)   BMI 42.02 kg/m   Specialty Comments:  No specialty comments available.  PMFS History: Patient Active Problem List   Diagnosis Date Noted  . 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 No pertinent past medical history.  Family History  Problem Relation Age of Onset  . Healthy Mother   . Diabetes Father   . Diabetes Maternal Grandfather     Past Surgical History:  Procedure Laterality Date  . WISDOM TOOTH EXTRACTION     Social History   Occupational History  . Not on file  Tobacco Use  . Smoking status: Never Smoker  . Smokeless tobacco: Never Used  Substance and Sexual Activity  . Alcohol use: Yes  . Drug use: Never  . Sexual activity: Yes    Birth control/protection: None    Comment: intercourse age 37, less than 5 sexual partners

## 2018-03-05 ENCOUNTER — Ambulatory Visit (INDEPENDENT_AMBULATORY_CARE_PROVIDER_SITE_OTHER): Payer: Self-pay

## 2018-03-05 ENCOUNTER — Ambulatory Visit (INDEPENDENT_AMBULATORY_CARE_PROVIDER_SITE_OTHER): Payer: BLUE CROSS/BLUE SHIELD | Admitting: Surgery

## 2018-03-05 ENCOUNTER — Encounter (INDEPENDENT_AMBULATORY_CARE_PROVIDER_SITE_OTHER): Payer: Self-pay | Admitting: Surgery

## 2018-03-05 DIAGNOSIS — M5136 Other intervertebral disc degeneration, lumbar region: Secondary | ICD-10-CM | POA: Diagnosis not present

## 2018-03-05 DIAGNOSIS — G8929 Other chronic pain: Secondary | ICD-10-CM | POA: Diagnosis not present

## 2018-03-05 DIAGNOSIS — M5416 Radiculopathy, lumbar region: Secondary | ICD-10-CM

## 2018-03-05 DIAGNOSIS — M545 Low back pain, unspecified: Secondary | ICD-10-CM

## 2018-03-05 NOTE — Progress Notes (Signed)
Consult Note   Patient: Aimee [name]             Date of Birth: 1995-07-15           MRN: [id number]             Visit Date: 03/05/2018  Requested by: Myles Lipps, MD [address] [place], [place] [address] PCP: Myles Lipps, MD  Thank you for asking me to evaluate this patient for Pain of the Lower Back  Below are my findings.     Assessment & Plan: Visit Diagnoses:  1. Chronic left-sided low back pain, unspecified whether sciatica present   2. Radiculopathy, lumbar region   3. Degenerative disc disease, lumbar     Plan: Since patient continues to have progressively worsening symptoms with low back pain and left lower extremity radiculopathy and has failed conservative treatment with injections, oral prednisone taper, gabapentin, chiropractic treatments, home exercise program I will schedule lumbar MRI to rule out HNP/stenosis.  Follow-up appointment with me after completion to discuss results and further treatment options.  At next office visit I will also review results with my attending Dr. Vira Browns.  I did take her out of work x1 week and we will discuss return status after we have reviewed MRI.  Follow Up Instructions: No follow-ups on file.  Orders: Orders Placed This Encounter  Procedures  . XR Lumbar Spine 2-3 Views  . MR Lumbar Spine w/o contrast   No orders of the defined types were placed in this encounter.    Procedures: No procedures performed   Clinical Data: No additional findings.   Subjective:  Chief Complaint  Patient presents with  . Lower Back - Pain     HPI 22 year old Hispanic female comes in today with complaints of chronic and worsening low back pain and left lower extremity radiculopathy.  States that she has had off-and-on problems with her low back for years.  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  Since injury this is been an ongoing  issue.  Symptoms progressively worsening over the last couple years.  While in [place] she had conservative management with Medrol injections that did not give any relief.  Over the last year she is had worsening low back pain with radiation into the left buttock down the left leg towards her calf.  No radicular symptoms on the right side.  Denies bowel or bladder incontinence.  Radicular symptoms worse with increased activity.  Her job is very strenuous and this does cause more issue.  She was seen by her primary care physician a few months ago who prescribed oral prednisone taper without any improvement.  She was also eventually prescribed gabapentin and is taking up to 900 mg at night also without any relief.  States that she has gone to several chiropractic treatments over the last several years most recently completed a 6-week treatment in July 2019 without any improvement.  States that she was told she had a bad disc in her lumbar spine.  She has also tried home exercise program given by her primary care physician and chiropractor without any improvement.  Extreme difficulty with ambulating periods, sitting, bending, twisting.  Review of Systems No current cardiac pulmonary GI GU issues   Objective: Vital Signs: There were no vitals taken for this visit.  Physical Exam  Constitutional: She is oriented to person, place, and time. She  appears well-nourished. No distress.  HENT:  Head: Normocephalic and atraumatic.  Eyes: Pupils are equal, round, and reactive to light. EOM are normal.  Neck: Normal range of motion.  Pulmonary/Chest: No respiratory distress.  Musculoskeletal:  Gait is somewhat antalgic.  Decreased lumbar extension with pain.  Lumbar flexion hands to knees with significant amount of discomfort.  Positive left lumbar paraspinal tenderness.  Moderate to marked left sciatic notch tenderness.  Negative on the right side.  Negative logroll bilateral hips.  Positive left straight leg  raise positive right contralateral straight leg raise.  Bilateral calves are nontender.  Neurovascular intact.  No focal motor deficits.  Neurological: She is alert and oriented to person, place, and time.  Skin: Skin is warm and dry.  Psychiatric: She has a normal mood and affect.     Ortho Exam   Specialty Comments:  No specialty comments available.  Imaging:  No results found.   PMFS History: Patient Active Problem List   Diagnosis Date Noted  . 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 No pertinent past medical history.  Family History  Problem Relation Age of Onset  . Healthy Mother   . Diabetes Father   . Diabetes Maternal Grandfather    Past Surgical History:  Procedure Laterality Date  . WISDOM TOOTH EXTRACTION     Social History   Occupational History  . Not on file  Tobacco Use  . Smoking status: Never Smoker  . Smokeless tobacco: Never Used  Substance and Sexual Activity  . Alcohol use: Yes  . Drug use: Never  . Sexual activity: Yes    Birth control/protection: None    Comment: intercourse age 4, less than 5 sexual partners

## 2018-03-13 ENCOUNTER — Telehealth (INDEPENDENT_AMBULATORY_CARE_PROVIDER_SITE_OTHER): Payer: Self-pay | Admitting: *Deleted

## 2018-03-13 ENCOUNTER — Encounter (INDEPENDENT_AMBULATORY_CARE_PROVIDER_SITE_OTHER): Payer: Self-pay | Admitting: Surgery

## 2018-03-13 ENCOUNTER — Ambulatory Visit (INDEPENDENT_AMBULATORY_CARE_PROVIDER_SITE_OTHER): Payer: BLUE CROSS/BLUE SHIELD | Admitting: Surgery

## 2018-03-13 DIAGNOSIS — M5416 Radiculopathy, lumbar region: Secondary | ICD-10-CM

## 2018-03-13 DIAGNOSIS — M5126 Other intervertebral disc displacement, lumbar region: Secondary | ICD-10-CM | POA: Diagnosis not present

## 2018-03-13 NOTE — Addendum Note (Signed)
Addended by: Albertina Parr on: 03/13/2018 11:11 AM   Modules accepted: Orders

## 2018-03-13 NOTE — Progress Notes (Signed)
22 year old female returns for recheck for low back pain and left lower extremity radiculopathy and to review lumbar MRI scan performed March 09, 2018.  Scan showed: Large central L4-5 disc extrusion with inferior migration impinging on the descending left-sided nerve roots in the lateral recess.  This is a likely source of left L5 radiculopathy.  Severe narrowing of the lateral recesses left greater than right.  Small left L5-S1 subarticular disc protrusion narrowing the left lateral recess.  Patient states that symptoms unchanged from previous visit.  Last week I did get Dr. Otelia Sergeant my attending to review the MRI and he recommended trying conservative management with formal PT along with doing a left L4-5 transforaminal ESI.   Plan We will put in orders for formal physical therapy and left L4-5 transforaminal ESI.  Stressed to patient the importance of proper lifting and bending techniques.  Her job requires her to do a lot of strenuous activity and I did encourage her to possibly try to seek employment that does not involve this.  We did talk about going back to school.  Long discussion with patient that if she fails conservative treatment with physical therapy and ESI that she may end up needing operative intervention with L4-5 microdiscectomy.  All questions answered.

## 2018-03-19 ENCOUNTER — Other Ambulatory Visit (INDEPENDENT_AMBULATORY_CARE_PROVIDER_SITE_OTHER): Payer: Self-pay | Admitting: Physical Medicine and Rehabilitation

## 2018-03-19 DIAGNOSIS — F411 Generalized anxiety disorder: Secondary | ICD-10-CM

## 2018-03-19 MED ORDER — DIAZEPAM 5 MG PO TABS: ORAL_TABLET | ORAL | 0 refills | Status: DC

## 2018-03-19 NOTE — Telephone Encounter (Signed)
rx sent today

## 2018-03-19 NOTE — Progress Notes (Signed)
Pre-procedure diazepam ordered for pre-operative anxiety.  

## 2018-03-19 NOTE — Telephone Encounter (Signed)
Called pt and advised.  

## 2018-03-21 ENCOUNTER — Encounter (INDEPENDENT_AMBULATORY_CARE_PROVIDER_SITE_OTHER): Payer: Self-pay | Admitting: Physical Medicine and Rehabilitation

## 2018-03-21 ENCOUNTER — Ambulatory Visit (INDEPENDENT_AMBULATORY_CARE_PROVIDER_SITE_OTHER): Payer: BLUE CROSS/BLUE SHIELD | Admitting: Physical Medicine and Rehabilitation

## 2018-03-21 ENCOUNTER — Ambulatory Visit (INDEPENDENT_AMBULATORY_CARE_PROVIDER_SITE_OTHER): Payer: BLUE CROSS/BLUE SHIELD

## 2018-03-21 VITALS — BP 105/54 | HR 78 | Temp 97.9°F

## 2018-03-21 DIAGNOSIS — M5416 Radiculopathy, lumbar region: Secondary | ICD-10-CM

## 2018-03-21 DIAGNOSIS — M5116 Intervertebral disc disorders with radiculopathy, lumbar region: Secondary | ICD-10-CM | POA: Diagnosis not present

## 2018-03-21 MED ORDER — BETAMETHASONE SOD PHOS & ACET 6 (3-3) MG/ML IJ SUSP
12.0000 mg | Freq: Once | INTRAMUSCULAR | Status: AC
  Administered 2018-03-21: 12 mg

## 2018-03-21 NOTE — Progress Notes (Signed)
Numeric Pain Rating Scale and Functional Assessment Average Pain 8   In the last MONTH (on 0-10 scale) has pain interfered with the following?  1. General activity like being  able to carry out your everyday physical activities such as walking, climbing stairs, carrying groceries, or moving a chair?  Rating(10)    +Driver, -BT, -Dye Allergies. 

## 2018-03-21 NOTE — Patient Instructions (Signed)

## 2018-04-04 NOTE — Progress Notes (Signed)
Aimee [name] - 22 y.o. female MRN [id number]  Date of birth: 1995-09-06  Office Visit Note: Visit Date: 03/21/2018 PCP: Myles Lipps, MD Referred by: Myles Lipps, MD  Subjective: Chief Complaint  Patient presents with  . Lower Back - Pain   HPI:  Aimee [name] is a 22 y.o. female who comes in today At the request of Zonia Kief, PA-C for diagnostic and hopefully therapeutic left L4 transforaminal epidural steroid injection.  She reports 8 out of 10 pain radiating from the left lower back into the left leg and calf.  This is been ongoing for about 4 years but is been really constant and worsening since June.  She reports worsening at work with lifting.  She did get some relief with tramadol but it made her very sleepy.  Functionally she is having some problems.  MRI was completed and reviewed below.  She has a large central disc extrusion with migration and severe narrowing of the lateral recess.  Depending on relief with L4 transforaminal injection consider L5 transforaminal injection.  Sometimes injection along the nerve root seems to be irritated works better than the injection at the level that looks like there is pathology.  Unfortunately with medicine this can go the other direction as well.  ROS Otherwise per HPI.  Assessment & Plan: Visit Diagnoses:  1. Lumbar radiculopathy   2. Radiculopathy due to lumbar intervertebral disc disorder     Plan: No additional findings.   Meds & Orders:  Meds ordered this encounter  Medications  . betamethasone acetate-betamethasone sodium phosphate (CELESTONE) injection 12 mg    Orders Placed This Encounter  Procedures  . XR C-ARM NO REPORT  . Epidural Steroid injection    Follow-up: Return for Vira Browns, MD.   Procedures: No procedures performed  Lumbosacral Transforaminal Epidural Steroid Injection - Sub-Pedicular Approach with Fluoroscopic Guidance  Patient: Aimee [name]      Date of Birth:  1995-09-01 MRN: [id number] PCP: Myles Lipps, MD      Visit Date: 03/21/2018   Universal Protocol:    Date/Time: 03/21/2018  Consent Given By: the patient  Position: PRONE  Additional Comments: Vital signs were monitored before and after the procedure. Patient was prepped and draped in the usual sterile fashion. The correct patient, procedure, and site was verified.   Injection Procedure Details:  Procedure Site One Meds Administered:  Meds ordered this encounter  Medications  . betamethasone acetate-betamethasone sodium phosphate (CELESTONE) injection 12 mg    Laterality: Left  Location/Site:  L4-L5  Needle size: 22 G  Needle type: Spinal  Needle Placement: Transforaminal  Findings:    -Comments: There was good excellent flow contrast into the foramen at L4 and along the pedicle.  Initial flow contrast was outside the foramen on the facet joint and there was some difficulty with patient pain response and trying to fully get the good epidural injection.  Ultimately she did quite well and we did get flow contrast around the pedicle in the epidural space.  Procedure Details: After squaring off the end-plates to get a true AP view, the C-arm was positioned so that an oblique view of the foramen as noted above was visualized. The target area is just inferior to the "nose of the scotty dog" or sub pedicular. The soft tissues overlying this structure were infiltrated with 2-3 ml. of 1% Lidocaine without Epinephrine.  The spinal needle was inserted toward the target using a "trajectory" view along the fluoroscope beam.  Under  AP and lateral visualization, the needle was advanced so it did not puncture dura and was located close the 6 O'Clock position of the pedical in AP tracterory. Biplanar projections were used to confirm position. Aspiration was confirmed to be negative for CSF and/or blood. A 1-2 ml. volume of Isovue-250 was injected and flow of contrast was noted at each  level. Radiographs were obtained for documentation purposes.   After attaining the desired flow of contrast documented above, a 0.5 to 1.0 ml test dose of 0.25% Marcaine was injected into each respective transforaminal space.  The patient was observed for 90 seconds post injection.  After no sensory deficits were reported, and normal lower extremity motor function was noted,   the above injectate was administered so that equal amounts of the injectate were placed at each foramen (level) into the transforaminal epidural space.   Additional Comments:  No complications occurred Dressing: Band-Aid    Post-procedure details: Patient was observed during the procedure. Post-procedure instructions were reviewed.  Patient left the clinic in stable condition.    Clinical History: MRI LUMBAR SPINE WITHOUT CONTRAST  TECHNIQUE: Multiplanar, multisequence MR imaging of the lumbar spine was performed. No intravenous contrast was administered.  COMPARISON: None.  FINDINGS: Segmentation: Normal. The lowest disc space is considered to be L5-S1.  Alignment: Normal  Vertebrae: No acute compression fracture, discitis-osteomyelitis of focal marrow lesion.  Conus medullaris and cauda equina: The conus medullaris terminates at the L1 level. The cauda equina and conus medullaris are both normal.  Paraspinal and other soft tissues: The visualized aorta, IVC and iliac vessels are normal. The visualized retroperitoneal organs and paraspinal soft tissues are normal.  Disc levels: Sagittal plane imaging includes the T12-L1 disc level through the upper sacrum, with axial imaging of the T12-L1 to L5-S1 disc levels.  The disc levels from T12-L1 to L3-L4 are normal.  L4-5: Large central disc extrusion with inferior migration to the L5 pedicle level. Severe narrowing of the lateral recesses, left-greater-than-right. There is mass effect on the descending left-sided nerve roots. No central spinal  canal stenosis or neural foraminal stenosis.  L5-S1: Small left subarticular disc protrusion narrows the left lateral recess. No central spinal canal or neural foraminal stenosis.  The visualized portion of the sacrum is normal.  IMPRESSION: 1. Large central L4-5 disc extrusion with inferior migration impinging on the descending left-sided nerve roots in the lateral recess. This is a likely source of left L5 radiculopathy. 2. Small left L5-S1 subarticular disc protrusion narrowing the left lateral recess.   Electronically Signed By: Deatra RobinsonKevin Herman M.D. On: 03/09/2018 15:58     Objective:  VS:  HT:    WT:   BMI:     BP:(!) 105/54  HR:78bpm  TEMP:97.9 F (36.6 C)(Oral)  RESP:100 % Physical Exam  Ortho Exam Imaging: No results found.

## 2018-04-04 NOTE — Procedures (Signed)
Lumbosacral Transforaminal Epidural Steroid Injection - Sub-Pedicular Approach with Fluoroscopic Guidance  Patient: Aimee [name]      Date of Birth: Dec 25, 1995 MRN: [id number] PCP: Myles LippsSantiago, Irma M, MD      Visit Date: 03/21/2018   Universal Protocol:    Date/Time: 03/21/2018  Consent Given By: the patient  Position: PRONE  Additional Comments: Vital signs were monitored before and after the procedure. Patient was prepped and draped in the usual sterile fashion. The correct patient, procedure, and site was verified.   Injection Procedure Details:  Procedure Site One Meds Administered:  Meds ordered this encounter  Medications  . betamethasone acetate-betamethasone sodium phosphate (CELESTONE) injection 12 mg    Laterality: Left  Location/Site:  L4-L5  Needle size: 22 G  Needle type: Spinal  Needle Placement: Transforaminal  Findings:    -Comments: There was good excellent flow contrast into the foramen at L4 and along the pedicle.  Initial flow contrast was outside the foramen on the facet joint and there was some difficulty with patient pain response and trying to fully get the good epidural injection.  Ultimately she did quite well and we did get flow contrast around the pedicle in the epidural space.  Procedure Details: After squaring off the end-plates to get a true AP view, the C-arm was positioned so that an oblique view of the foramen as noted above was visualized. The target area is just inferior to the "nose of the scotty dog" or sub pedicular. The soft tissues overlying this structure were infiltrated with 2-3 ml. of 1% Lidocaine without Epinephrine.  The spinal needle was inserted toward the target using a "trajectory" view along the fluoroscope beam.  Under AP and lateral visualization, the needle was advanced so it did not puncture dura and was located close the 6 O'Clock position of the pedical in AP tracterory. Biplanar projections were used to  confirm position. Aspiration was confirmed to be negative for CSF and/or blood. A 1-2 ml. volume of Isovue-250 was injected and flow of contrast was noted at each level. Radiographs were obtained for documentation purposes.   After attaining the desired flow of contrast documented above, a 0.5 to 1.0 ml test dose of 0.25% Marcaine was injected into each respective transforaminal space.  The patient was observed for 90 seconds post injection.  After no sensory deficits were reported, and normal lower extremity motor function was noted,   the above injectate was administered so that equal amounts of the injectate were placed at each foramen (level) into the transforaminal epidural space.   Additional Comments:  No complications occurred Dressing: Band-Aid    Post-procedure details: Patient was observed during the procedure. Post-procedure instructions were reviewed.  Patient left the clinic in stable condition.

## 2018-05-05 ENCOUNTER — Ambulatory Visit (INDEPENDENT_AMBULATORY_CARE_PROVIDER_SITE_OTHER): Payer: BLUE CROSS/BLUE SHIELD | Admitting: Specialist

## 2018-08-13 HISTORY — PX: MICRODISCECTOMY LUMBAR: SUR864

## 2019-11-04 ENCOUNTER — Ambulatory Visit (HOSPITAL_COMMUNITY)
Admission: EM | Admit: 2019-11-04 | Discharge: 2019-11-04 | Disposition: A | Discharge status: Home / Self Care | Payer: Self-pay | Attending: Internal Medicine | Admitting: Internal Medicine

## 2019-11-04 ENCOUNTER — Encounter (HOSPITAL_COMMUNITY): Payer: Self-pay | Admitting: Emergency Medicine

## 2019-11-04 ENCOUNTER — Other Ambulatory Visit: Payer: Self-pay

## 2019-11-04 DIAGNOSIS — M76821 Posterior tibial tendinitis, right leg: Secondary | ICD-10-CM

## 2019-11-04 MED ORDER — IBUPROFEN 600 MG PO TABS: 600.0000 mg | ORAL_TABLET | Freq: Three times a day (TID) | ORAL | 0 refills | Status: DC | PRN

## 2019-11-04 NOTE — ED Triage Notes (Signed)
Pt c/o right foot pain and swelling onset 4 days ago. Pt denies any known injury. Pt has not been taking anything for the pain. She states it is painful to walk on.

## 2019-11-06 NOTE — ED Provider Notes (Signed)
[facility]    CSN: [id number] Arrival date & time: 11/04/19  1713      History   Chief Complaint Chief Complaint  Patient presents with  . Foot Pain    HPI Aimee [name] is a 24 y.o. female painful right foot swelling of 4 days duration.  Patient denies any trauma.  She says the pain is of moderate severity.  She is able to bear weight.  Pain is sharp aggravated by dorsiflexing her right foot.  Is not worse with plantarflexion.  Pain is worse with by palpation as well.  Mild erythema over the anterior part of the right ankle.  No fever. HPI  History reviewed. No pertinent past medical history.  Patient Active Problem List   Diagnosis Date Noted  . Papanicolaou smear of cervix with low grade squamous intraepithelial lesion (LGSIL) 09/13/2017    Past Surgical History:  Procedure Laterality Date  . MICRODISCECTOMY LUMBAR  08/2018  . WISDOM TOOTH EXTRACTION      OB History    Gravida  0   Para  0   Term  0   Preterm  0   AB  0   Living  0     SAB  0   TAB  0   Ectopic  0   Multiple  0   Live Births  0            Home Medications    Prior to Admission medications   Medication Sig Start Date End Date Taking? Authorizing Provider  ibuprofen (ADVIL) 600 MG tablet Take 1 tablet (600 mg total) by mouth every 8 (eight) hours as needed. 11/04/19   Kobe Jansma, Myrene Galas, MD  gabapentin (NEURONTIN) 300 MG capsule Take 1 capsule (300 mg total) by mouth 3 (three) times daily. 11/01/17 11/04/19  Rutherford Guys, MD  omeprazole (PRILOSEC) 20 MG capsule Take 1 capsule (20 mg total) by mouth 2 (two) times daily before a meal. 11/01/17 11/04/19  Rutherford Guys, MD    Family History Family History  Problem Relation Age of Onset  . Healthy Mother   . Diabetes Father   . Diabetes Maternal Grandfather     Social History Social History   Tobacco Use  . Smoking status: Never Smoker  . Smokeless tobacco: Never Used  Vaping Use  . Vaping Use:  Never used  Substance Use Topics  . Alcohol use: Yes  . Drug use: Never     Allergies   Patient has no known allergies.   Review of Systems Review of Systems  HENT: Negative.   Respiratory: Negative.   Musculoskeletal: Positive for arthralgias and joint swelling. Negative for myalgias and neck pain.  Skin: Positive for color change. Negative for rash and wound.  Neurological: Negative for dizziness, light-headedness and headaches.     Physical Exam Triage Vital Signs ED Triage Vitals  Enc Vitals Group     BP 11/04/19 1733 128/68     Pulse Rate 11/04/19 1733 75     Resp 11/04/19 1733 19     Temp 11/04/19 1733 98.5 F (36.9 C)     Temp Source 11/04/19 1733 Oral     SpO2 11/04/19 1733 100 %     Weight --      Height --      Head Circumference --      Peak Flow --      Pain Score 11/04/19 1730 4     Pain Loc --  Pain Edu? --      Excl. in GC? --    No data found.  Updated Vital Signs BP 128/68 (BP Location: Right Arm)   Pulse 75   Temp 98.5 F (36.9 C) (Oral)   Resp 19   LMP 10/28/2019   SpO2 100%   Visual Acuity Right Eye Distance:   Left Eye Distance:   Bilateral Distance:    Right Eye Near:   Left Eye Near:    Bilateral Near:     Physical Exam Vitals and nursing note reviewed.  Constitutional:      General: She is not in acute distress.    Appearance: She is not ill-appearing.  Cardiovascular:     Rate and Rhythm: Normal rate and regular rhythm.     Pulses: Normal pulses.     Heart sounds: Normal heart sounds.  Musculoskeletal:        General: Swelling and tenderness present.     Comments: Patient has tenderness over the anterior tibialis tendon.  Mild erythema.  Skin:    General: Skin is warm.     Capillary Refill: Capillary refill takes less than 2 seconds.  Neurological:     Mental Status: She is alert.      UC Treatments / Results  Labs (all labs ordered are listed, but only abnormal results are displayed) Labs Reviewed - No  data to display  EKG   Radiology No results found.  Procedures Procedures (including critical care time)  Medications Ordered in UC Medications - No data to display  Initial Impression / Assessment and Plan / UC Course  I have reviewed the triage vital signs and the nursing notes.  Pertinent labs & imaging results that were available during my care of the patient were reviewed by me and considered in my medical decision making (see chart for details).     1.  Right tibialis tendinitis: Ibuprofen 600 mg every 8 hours as needed Gentle range of motion exercises Ankle brace Elevate right leg If symptoms persist patient is advised to return to urgent care to be reevaluated or call the [facility] for an appointment. Final Clinical Impressions(s) / UC Diagnoses   Final diagnoses:  Tibialis tendinitis of right lower extremity   Discharge Instructions   None    ED Prescriptions    Medication Sig Dispense Auth. Provider   ibuprofen (ADVIL) 600 MG tablet Take 1 tablet (600 mg total) by mouth every 8 (eight) hours as needed. 30 tablet Wesson Stith, Britta Mccreedy, MD     PDMP not reviewed this encounter.   Merrilee Jansky, MD 11/06/19 1910

## 2019-12-22 ENCOUNTER — Telehealth (INDEPENDENT_AMBULATORY_CARE_PROVIDER_SITE_OTHER): Payer: 59 | Admitting: Family Medicine

## 2019-12-22 ENCOUNTER — Other Ambulatory Visit: Payer: Self-pay

## 2019-12-22 DIAGNOSIS — R7303 Prediabetes: Secondary | ICD-10-CM

## 2019-12-22 DIAGNOSIS — F4321 Adjustment disorder with depressed mood: Secondary | ICD-10-CM

## 2019-12-22 DIAGNOSIS — G44209 Tension-type headache, unspecified, not intractable: Secondary | ICD-10-CM

## 2019-12-22 DIAGNOSIS — G43709 Chronic migraine without aura, not intractable, without status migrainosus: Secondary | ICD-10-CM

## 2019-12-22 MED ORDER — CYCLOBENZAPRINE HCL 10 MG PO TABS: 10.0000 mg | ORAL_TABLET | Freq: Three times a day (TID) | ORAL | 2 refills | Status: DC | PRN

## 2019-12-22 MED ORDER — METFORMIN HCL ER 500 MG PO TB24: 1000.0000 mg | ORAL_TABLET | Freq: Every day | ORAL | 1 refills | Status: DC

## 2019-12-22 MED ORDER — SUMATRIPTAN SUCCINATE 50 MG PO TABS: 50.0000 mg | ORAL_TABLET | ORAL | 2 refills | Status: DC | PRN

## 2019-12-22 NOTE — Patient Instructions (Addendum)
[facility]  [phone]    If you have lab work done today you will be contacted with your lab results within the next 2 weeks.  If you have not heard from [place] then please contact us. The fastest way to get your results is to register for My Chart.   IF you received an x-ray today, you will receive an invoice from [facility]. Please contact [facility] at [phone] with questions or concerns regarding your invoice.   IF you received labwork today, you will receive an invoice from [facility]. Please contact [facility] at [phone] with questions or concerns regarding your invoice.   Our billing staff will not be able to assist you with questions regarding bills from these companies.  You will be contacted with the lab results as soon as they are available. The fastest way to get your results is to activate your My Chart account. Instructions are located on the last page of this paperwork. If you have not heard from [place] regarding the results in 2 weeks, please contact this office.

## 2019-12-22 NOTE — Progress Notes (Signed)
Virtual Visit Note  I connected with patient on 12/22/19 at 512pm by video by epic and verified that I am speaking with the correct person using two identifiers. Aimee [name] is currently located at home and patient is currently with them during visit. The provider, Myles Lipps, MD is located in their office at time of visit.  I discussed the limitations, risks, security and privacy concerns of performing an evaluation and management service by telephone and the availability of in person appointments. I also discussed with the patient that there may be a patient responsible charge related to this service. The patient expressed understanding and agreed to proceed.   I provided 12 minutes of non-face-to-face time during this encounter.  Chief Complaint  Patient presents with  . Migraine    pt is having migraines that started 1 wk ago and are getting increasingly worse. Knows the headaches are because of the immagration denial her husband just received. She is taking tylenol for the pain, doens not work. She has not had to miss any days of work has of yet. the pain causes photo sensitivity. Denies nausea.    HPI ? Patient has been having headaches since June when her husband's immigration case was denied She reports that most headaches are occipital, left side, feels knots in back of head/neck area, feels tight However she occasionally has headaches behind her Left eye that are throbbing, with nausea and light sensitivity, these headaches are sign worse She has been taking ibu/naproxen several times a day, daily  She is also very sad re husbands situation She has no one to talk with  She has not been really wanting to go out  She denies problems with depression in the past She denies any SI  She is requesting refill of metformin which she takes for PCOS and prediabetes  No Known Allergies  Prior to Admission medications   Medication Sig Start Date End Date Taking?  Authorizing Provider  metFORMIN (GLUMETZA) 1000 MG (MOD) 24 hr tablet Take 1,000 mg by mouth daily with breakfast.   Yes [provider]  gabapentin (NEURONTIN) 300 MG capsule Take 1 capsule (300 mg total) by mouth 3 (three) times daily. 11/01/17 11/04/19  Myles Lipps, MD  omeprazole (PRILOSEC) 20 MG capsule Take 1 capsule (20 mg total) by mouth 2 (two) times daily before a meal. 11/01/17 11/04/19  Myles Lipps, MD    Past Medical History:  Diagnosis Date  . Anemia    Phreesia 12/22/2019    Past Surgical History:  Procedure Laterality Date  . MICRODISCECTOMY LUMBAR  08/2018  . SPINE SURGERY N/A    Phreesia 12/22/2019  . WISDOM TOOTH EXTRACTION      Social History   Tobacco Use  . Smoking status: Never Smoker  . Smokeless tobacco: Never Used  Substance Use Topics  . Alcohol use: Yes    Family History  Problem Relation Age of Onset  . Healthy Mother   . Diabetes Father   . Diabetes Maternal Grandfather     ROS Per hpi  Objective  Vitals as reported by the patient: none  GEN: AAOx3, NAD HEENT: [place]/AT, pupils are symmetrical, EOMI, non-icteric sclera Resp: breathing comfortably, speaking in full sentences Skin: no rashes noted, no pallor Psych: good eye contact, normal mood and affect   ASSESSMENT and PLAN  1. Tension headache Discussed supportive measures, new meds r/se/b and RTC precautions. Discussed avoidance of nsaids/rebound headaches  2. Chronic migraine without aura without status  migrainosus, not intractable rx for imitrex given, reviewed r/se/b, RTC precautions given  3. Prediabetes - Hemoglobin A1c; Future - Comprehensive metabolic panel; Future  4. Adjustment disorder with depressed mood Contact info for counseling given  Other orders - cyclobenzaprine (FLEXERIL) 10 MG tablet; Take 1 tablet (10 mg total) by mouth 3 (three) times daily as needed for muscle spasms. - SUMAtriptan (IMITREX) 50 MG tablet; Take 1 tablet (50 mg total) by  mouth every 2 (two) hours as needed for migraine. May repeat in 2 hours if headache persists or recurs. - metFORMIN (GLUCOPHAGE-XR) 500 MG 24 hr tablet; Take 2 tablets (1,000 mg total) by mouth daily with breakfast.  FOLLOW-UP: 6 months   The above assessment and management plan was discussed with the patient. The patient verbalized understanding of and has agreed to the management plan. Patient is aware to call the clinic if symptoms persist or worsen. Patient is aware when to return to the clinic for a follow-up visit. Patient educated on when it is appropriate to go to the emergency department.     Myles Lipps, MD Primary Care at [facility] [address] [place], [place] [address] Ph.  [phone] Fax [phone]

## 2021-08-15 ENCOUNTER — Institutional Professional Consult (permissible substitution): Payer: Self-pay | Admitting: Plastic Surgery

## 2021-08-17 ENCOUNTER — Other Ambulatory Visit (HOSPITAL_COMMUNITY): Payer: Self-pay | Admitting: Surgery

## 2021-09-01 ENCOUNTER — Other Ambulatory Visit (HOSPITAL_COMMUNITY): Payer: Self-pay

## 2021-09-18 ENCOUNTER — Other Ambulatory Visit: Payer: Self-pay

## 2021-09-18 ENCOUNTER — Ambulatory Visit (HOSPITAL_COMMUNITY)
Admission: RE | Admit: 2021-09-18 | Discharge: 2021-09-18 | Disposition: A | Discharge status: Home / Self Care | Payer: 59 | Source: Ambulatory Visit | Attending: Surgery | Admitting: Surgery

## 2021-09-18 DIAGNOSIS — Z01818 Encounter for other preprocedural examination: Secondary | ICD-10-CM | POA: Insufficient documentation

## 2021-09-21 ENCOUNTER — Ambulatory Visit: Payer: Self-pay | Admitting: Skilled Nursing Facility1

## 2021-10-02 ENCOUNTER — Inpatient Hospital Stay (HOSPITAL_COMMUNITY): Admission: RE | Admit: 2021-10-02 | Payer: 59 | Source: Ambulatory Visit

## 2021-10-02 ENCOUNTER — Encounter (HOSPITAL_COMMUNITY): Payer: Self-pay

## 2021-10-16 ENCOUNTER — Ambulatory Visit: Payer: Self-pay | Admitting: Skilled Nursing Facility1

## 2021-10-18 ENCOUNTER — Ambulatory Visit (HOSPITAL_COMMUNITY): Payer: Self-pay | Admitting: Licensed Clinical Social Worker

## 2023-04-01 ENCOUNTER — Other Ambulatory Visit: Payer: Self-pay | Admitting: Family Medicine

## 2023-04-01 DIAGNOSIS — R9389 Abnormal findings on diagnostic imaging of other specified body structures: Secondary | ICD-10-CM

## 2023-04-22 ENCOUNTER — Ambulatory Visit
Admission: RE | Admit: 2023-04-22 | Discharge: 2023-04-22 | Disposition: A | Discharge status: Home / Self Care | Payer: Commercial Managed Care - HMO | Source: Ambulatory Visit | Attending: Family Medicine

## 2023-04-22 DIAGNOSIS — R9389 Abnormal findings on diagnostic imaging of other specified body structures: Secondary | ICD-10-CM

## 2023-05-06 ENCOUNTER — Other Ambulatory Visit: Payer: Commercial Managed Care - HMO

## 2023-05-15 DIAGNOSIS — D352 Benign neoplasm of pituitary gland: Secondary | ICD-10-CM

## 2023-05-15 HISTORY — DX: Benign neoplasm of pituitary gland: D35.2

## 2023-06-07 ENCOUNTER — Emergency Department (HOSPITAL_COMMUNITY)
Admission: EM | Admit: 2023-06-07 | Discharge: 2023-06-07 | Disposition: A | Discharge status: Home / Self Care | Payer: Self-pay | Attending: Emergency Medicine | Admitting: Emergency Medicine

## 2023-06-07 ENCOUNTER — Emergency Department (HOSPITAL_COMMUNITY): Payer: Self-pay

## 2023-06-07 ENCOUNTER — Encounter (HOSPITAL_COMMUNITY): Payer: Self-pay

## 2023-06-07 ENCOUNTER — Other Ambulatory Visit: Payer: Self-pay

## 2023-06-07 DIAGNOSIS — M545 Low back pain, unspecified: Secondary | ICD-10-CM

## 2023-06-07 LAB — PREGNANCY, URINE: Preg Test, Ur: NEGATIVE

## 2023-06-07 MED ORDER — METHYLPREDNISOLONE 4 MG PO TBPK: ORAL_TABLET | ORAL | 0 refills | Status: DC

## 2023-06-07 MED ORDER — HYDROCODONE-ACETAMINOPHEN 5-325 MG PO TABS
1.0000 | ORAL_TABLET | Freq: Once | ORAL | Status: AC
  Administered 2023-06-07: 1 via ORAL
  Filled 2023-06-07: qty 1

## 2023-06-07 MED ORDER — HYDROCODONE-ACETAMINOPHEN 5-325 MG PO TABS: 1.0000 | ORAL_TABLET | Freq: Four times a day (QID) | ORAL | 0 refills | Status: DC | PRN

## 2023-06-07 NOTE — ED Provider Notes (Signed)
[facility] EMERGENCY DEPARTMENT AT [facility] Provider Note   CSN: [id number] Arrival date & time: 06/07/23  0751     History  Chief Complaint  Patient presents with   Back Pain    Gurneet SCARFONE is a 28 y.o. female.  HPI   28 year old female with past medical history of lumbar disc herniation status post discectomy at L4/5 at [facility] in 2022 presents emergency department with a left lower back/buttocks pain.  Patient states since her surgery in 2020 she has been pain-free.  Starting last night she developed left buttocks pain, was uncomfortable overnight and woke up this morning to worsening pain.  Pain is worse with certain movements and palpation.  The pain does not radiate down the left leg, she has no numbness, weakness, saddle anesthesia, incontinence.  States that this feels similar to when she was initially injured before the surgery.  Home Medications Prior to Admission medications   Medication Sig Start Date End Date Taking? Authorizing Provider  cyclobenzaprine (FLEXERIL) 10 MG tablet Take 1 tablet (10 mg total) by mouth 3 (three) times daily as needed for muscle spasms. 12/22/19   Lezlie Lye, Meda Coffee, MD  metFORMIN (GLUCOPHAGE-XR) 500 MG 24 hr tablet Take 2 tablets (1,000 mg total) by mouth daily with breakfast. 12/22/19   Lezlie Lye, Meda Coffee, MD  SUMAtriptan (IMITREX) 50 MG tablet Take 1 tablet (50 mg total) by mouth every 2 (two) hours as needed for migraine. May repeat in 2 hours if headache persists or recurs. 12/22/19   Noni Saupe, MD  gabapentin (NEURONTIN) 300 MG capsule Take 1 capsule (300 mg total) by mouth 3 (three) times daily. 11/01/17 11/04/19  Noni Saupe, MD  omeprazole (PRILOSEC) 20 MG capsule Take 1 capsule (20 mg total) by mouth 2 (two) times daily before a meal. 11/01/17 11/04/19  Lezlie Lye, Meda Coffee, MD      Allergies    Patient has no known allergies.    Review of Systems   Review of Systems   Constitutional:  Negative for fever.  Respiratory:  Negative for shortness of breath.   Cardiovascular:  Negative for chest pain.  Gastrointestinal:  Negative for abdominal pain, diarrhea and vomiting.  Genitourinary:  Negative for dysuria and frequency.  Musculoskeletal:  Positive for back pain. Negative for neck pain.  Skin:  Negative for rash.  Neurological:  Negative for weakness, numbness and headaches.    Physical Exam Updated Vital Signs BP 132/85 (BP Location: Right Arm)   Pulse 84   Temp (!) 97.4 F (36.3 C)   Resp 18   Ht 5\' 6"  (1.676 m)   Wt 122.5 kg   LMP 05/24/2023   SpO2 100%   BMI 43.58 kg/m  Physical Exam Vitals and nursing note reviewed.  Constitutional:      Appearance: Normal appearance.  HENT:     Head: Normocephalic.     Mouth/Throat:     Mouth: Mucous membranes are moist.  Cardiovascular:     Rate and Rhythm: Normal rate.  Pulmonary:     Effort: Pulmonary effort is normal. No respiratory distress.  Abdominal:     Palpations: Abdomen is soft.     Tenderness: There is no abdominal tenderness.  Musculoskeletal:     Comments: Tenderness to palpation of the midline and left paraspinal lumbar musculature leading into the left buttock/SI joint.  Patient is neurovascularly intact in the left lower extremity, no saddle anesthesia  Skin:    General:  Skin is warm.  Neurological:     Mental Status: She is alert and oriented to person, place, and time. Mental status is at baseline.  Psychiatric:        Mood and Affect: Mood normal.     ED Results / Procedures / Treatments   Labs (all labs ordered are listed, but only abnormal results are displayed) Labs Reviewed  PREGNANCY, URINE    EKG None  Radiology No results found.  Procedures Procedures    Medications Ordered in ED Medications - No data to display  ED Course/ Medical Decision Making/ A&P                                 Medical Decision Making Amount and/or Complexity of Data  Reviewed Labs: ordered. Radiology: ordered.  Risk Prescription drug management.   28 year old female presents emergency department with left lower back and buttock pain.  History of previous disc herniation L4-5 with discectomy in 2020.  Vitals normal stable, she is neuro intact, no red flag symptoms but with worsening pain.  Slightly improved with oral pain medicine.  CT here identifies a paracentral disc herniation at S1-S2 affecting one of the sacral roots, correlating with her discomfort.  No other acute findings.  Patient is feeling improved.  Still has contact with her neurosurgeon.  I will treat her symptomatically and refer outpatient.  Patient is ambulatory.  Patient at this time appears safe and stable for discharge and close outpatient follow up. Discharge plan and strict return to ED precautions discussed, patient verbalizes understanding and agreement.        Final Clinical Impression(s) / ED Diagnoses Final diagnoses:  None    Rx / DC Orders ED Discharge Orders     None         Rozelle Logan, DO 06/07/23 1415

## 2023-06-07 NOTE — ED Triage Notes (Signed)
Pt had back surgery on L3 and L4 in 2020 and states she is having back pain for 1 week and got worse last night. Pt states she is having a hard time walking and bending down. Denies bowel/bladder issues. Able to feel bilateral lower extremities.

## 2023-06-07 NOTE — Discharge Instructions (Signed)
You have been seen and discharged from the emergency department.  Your CT scan showed a small disc herniation lower in the spine that is affecting one of your sacral nerves.  This is most likely causing your symptoms.  Take the steroid Dosepak to reduce nerve irritation.  You may take ibuprofen, 600 mg every 8 hours as needed.  Use stronger pain medicine as needed.  Do not mix this medication with alcohol or other sedating medications. Do not drive or do heavy physical activity until you know how this medication affects you.  It may cause drowsiness.  Continue to follow-up with neuro spine specialist, I have attached some follow-up information.  Follow-up with your primary provider for further evaluation and further care. Take home medications as prescribed. If you have any worsening symptoms or further concerns for your health please return to an emergency department for further evaluation.

## 2023-07-10 ENCOUNTER — Encounter: Payer: Self-pay | Admitting: Obstetrics and Gynecology

## 2023-07-10 ENCOUNTER — Ambulatory Visit (INDEPENDENT_AMBULATORY_CARE_PROVIDER_SITE_OTHER): Payer: Self-pay | Admitting: Obstetrics and Gynecology

## 2023-07-10 VITALS — BP 104/62 | HR 90 | Temp 98.1°F | Ht 66.0 in | Wt 286.0 lb

## 2023-07-10 DIAGNOSIS — N760 Acute vaginitis: Secondary | ICD-10-CM

## 2023-07-10 DIAGNOSIS — B9689 Other specified bacterial agents as the cause of diseases classified elsewhere: Secondary | ICD-10-CM

## 2023-07-10 DIAGNOSIS — N898 Other specified noninflammatory disorders of vagina: Secondary | ICD-10-CM

## 2023-07-10 DIAGNOSIS — E282 Polycystic ovarian syndrome: Secondary | ICD-10-CM | POA: Diagnosis not present

## 2023-07-10 DIAGNOSIS — B3731 Acute candidiasis of vulva and vagina: Secondary | ICD-10-CM

## 2023-07-10 DIAGNOSIS — R7989 Other specified abnormal findings of blood chemistry: Secondary | ICD-10-CM

## 2023-07-10 DIAGNOSIS — E221 Hyperprolactinemia: Secondary | ICD-10-CM | POA: Diagnosis not present

## 2023-07-10 DIAGNOSIS — E119 Type 2 diabetes mellitus without complications: Secondary | ICD-10-CM | POA: Insufficient documentation

## 2023-07-10 DIAGNOSIS — Q519 Congenital malformation of uterus and cervix, unspecified: Secondary | ICD-10-CM | POA: Diagnosis not present

## 2023-07-10 DIAGNOSIS — Z113 Encounter for screening for infections with a predominantly sexual mode of transmission: Secondary | ICD-10-CM

## 2023-07-10 DIAGNOSIS — Z8619 Personal history of other infectious and parasitic diseases: Secondary | ICD-10-CM | POA: Insufficient documentation

## 2023-07-10 NOTE — Assessment & Plan Note (Signed)
 Patient presents with concern for possible uterine anomaly. Records request completed on [facility] for her recent ultrasound results. Discussed evaluation of uterine cavity with 3D ultrasound versus HSG. Will follow-up at next appointment.

## 2023-07-10 NOTE — Assessment & Plan Note (Signed)
 As above.

## 2023-07-10 NOTE — Progress Notes (Signed)
 28 y.o. 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  She also was lost to endocrine and PCP care. Hx of spine surgery in 2020-2021?  Patient's last menstrual period was 06/28/2023 (approximate).  Q30-35d, more regular at this time Normal limits as well.  She was evaluated at [facility] last year due to right lower quadrant pain.  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  Her evaluation also was positive for chlamydia, diagnosed in November 2024.  Both her and her husband are s/p treatment completion.  She was also treated for BV.  Today, she notes vaginal irritation with discharge and odor.  RLQ pain resolved since treatment for chlamydia. She is not a monogamous relationship with her husband and assumes her infection was present prior to their relationship.  SA: yes, would like to conceive  Chart review reveals last endocrinology appointment in February 28, 2021.  Patient was on 2 g of metformin daily and repeat labs are ordered.  They were considering cabergoline and clomid use pending prolactin level in setting of irregular periods. Testosterone 61 (highest was 84 01/25/2021) Prolactin 30.54 (highest 39 on 09/09/2020) DHEA-503 (highest was 793 on 09/09/2020) 17 OHP, cortisol was normal  02/22/2021 MRI pituitary was unremarkable. 06/08/2020 A1c 6.0  Approximate 20 pound weight gain since 2022.  GYN HISTORY: Miscarriage at 28yo, spontaneous  OB History  Gravida Para Term Preterm AB Living  1 0 0 0 1 0  SAB IAB Ectopic Multiple Live Births  1 0 0 0 0    # Outcome Date GA Lbr Len/2nd Weight Sex Type Anes PTL Lv  1 SAB              Past Medical History:  Diagnosis Date   Anemia    Phreesia 12/22/2019   Diabetes mellitus without complication [facility])     Past Surgical History:  Procedure Laterality Date   MICRODISCECTOMY LUMBAR  08/2018   SPINE SURGERY N/A    Phreesia 12/22/2019   WISDOM TOOTH EXTRACTION      Current Outpatient Medications on File Prior to Visit  Medication Sig Dispense Refill   Prenatal Vit-Fe Fumarate-FA (MULTIVITAMIN-PRENATAL) 27-0.8 MG TABS tablet Take 1 tablet by mouth daily at 12 noon.     [DISCONTINUED] gabapentin (NEURONTIN) 300 MG capsule Take 1 capsule (300 mg total) by mouth 3 (three) times daily. 90 capsule 3   [DISCONTINUED] omeprazole (PRILOSEC) 20 MG capsule Take 1 capsule (20 mg total) by mouth 2 (two) times daily before a meal. 60 capsule 0   No current facility-administered medications on file prior to visit.    No Known Allergies    PE Today's Vitals   07/10/23 1459  BP: 104/62  Pulse: 90  Temp: 98.1 F (36.7 C)  TempSrc: Oral  SpO2: 99%  Weight: 286 lb (129.7 kg)  Height: 5\' 6"  (1.676 m)   Body mass index is 46.16 kg/m.  Physical Exam Vitals reviewed. Exam conducted with a chaperone present.  Constitutional:      General: She is not in acute distress.    Appearance: Normal appearance.  HENT:     Head: Normocephalic and atraumatic.     Nose: Nose normal.  Eyes:  Extraocular Movements: Extraocular movements intact.     Conjunctiva/sclera: Conjunctivae normal.  Pulmonary:     Effort: Pulmonary effort is normal.  Genitourinary:    General: Normal vulva.     Exam position: Lithotomy position.     Vagina: Vaginal discharge present.     Cervix: Normal. No cervical motion tenderness, discharge or lesion.     Uterus: Normal. Not enlarged and not tender.      Adnexa: Right adnexa normal and left adnexa normal.  Musculoskeletal:        General: Normal range of motion.     Cervical back: Normal range of motion.  Neurological:     General: No  focal deficit present.     Mental Status: She is alert.  Psychiatric:        Mood and Affect: Mood normal.        Behavior: Behavior normal.       Assessment and Plan:        Uterine anomaly Assessment & Plan: Patient presents with concern for possible uterine anomaly. Records request completed on [facility] for her recent ultrasound results. Discussed evaluation of uterine cavity with 3D ultrasound versus HSG. Will follow-up at next appointment.   PCOS (polycystic ovarian syndrome) Assessment & Plan: Patient with known PCOS, hyperprolactinemia and elevated DHEA-S lost to endocrine follow-up. Lab trend from 2022 showed normal downtrending hormone levels without medical therapy. Will check labs today and refer for any derangements.  Orders: -     Testos,Total,Free and SHBG (Female) -     Hemoglobin A1c  Hyperprolactinemia (HCC) Assessment & Plan: As above  Orders: -     Estradiol -     Prolactin  Elevated dehydroepiandrosterone (DHEA) level Assessment & Plan: As above  Orders: -     DHEA-sulfate  Screen for STD (sexually transmitted disease) -     Hepatitis B surface antigen -     Hepatitis C antibody -     RPR -     HIV Antibody (routine testing w rflx) -     SureSwab Advanced Vaginitis Plus,TMA  History of chlamydia Assessment & Plan: Recommend complete STD screening.  Orders: -     Hepatitis B surface antigen -     Hepatitis C antibody -     RPR -     HIV Antibody (routine testing w rflx) -     SureSwab Advanced Vaginitis Plus,TMA  Vaginal discharge -     SureSwab Advanced Vaginitis Plus,TMA  Type 2 diabetes mellitus without complication, without long-term current use of insulin (HCC) -     Hemoglobin A1c   Will need PCP referral pending results  Rosalyn Gess, MD

## 2023-07-10 NOTE — Assessment & Plan Note (Signed)
 Recommend complete STD screening.

## 2023-07-10 NOTE — Assessment & Plan Note (Addendum)
 Patient with known PCOS, hyperprolactinemia and elevated DHEA-S lost to endocrine follow-up. Lab trend from 2022 showed normal downtrending hormone levels without medical therapy. Will check labs today and refer for any derangements.

## 2023-07-11 LAB — SURESWAB® ADVANCED VAGINITIS PLUS,TMA
C. trachomatis RNA, TMA: NOT DETECTED
CANDIDA SPECIES: DETECTED — AB
Candida glabrata: NOT DETECTED
N. gonorrhoeae RNA, TMA: NOT DETECTED
SURESWAB(R) ADV BACTERIAL VAGINOSIS(BV),TMA: POSITIVE — AB
TRICHOMONAS VAGINALIS (TV),TMA: NOT DETECTED

## 2023-07-16 LAB — TESTOS,TOTAL,FREE AND SHBG (FEMALE)
Free Testosterone: 3.1 pg/mL (ref 0.1–6.4)
Sex Hormone Binding: 14.4 nmol/L — ABNORMAL LOW (ref 17–124)
Testosterone, Total, LC-MS-MS: 16 ng/dL (ref 2–45)

## 2023-07-16 LAB — ESTRADIOL: Estradiol: 66 pg/mL

## 2023-07-16 LAB — PROLACTIN: Prolactin: 26.3 ng/mL

## 2023-07-16 LAB — HIV ANTIBODY (ROUTINE TESTING W REFLEX): HIV 1&2 Ab, 4th Generation: NONREACTIVE

## 2023-07-16 LAB — HEPATITIS B SURFACE ANTIGEN: Hepatitis B Surface Ag: NONREACTIVE

## 2023-07-16 LAB — RPR: RPR Ser Ql: NONREACTIVE

## 2023-07-16 LAB — HEMOGLOBIN A1C
Hgb A1c MFr Bld: 6 %{Hb} — ABNORMAL HIGH (ref <5.7)
Mean Plasma Glucose: 126 mg/dL
eAG (mmol/L): 7 mmol/L

## 2023-07-16 LAB — HEPATITIS C ANTIBODY: Hepatitis C Ab: NONREACTIVE

## 2023-07-16 LAB — DHEA-SULFATE: DHEA-SO4: 222 ug/dL (ref 14–349)

## 2023-07-18 MED ORDER — FLUCONAZOLE 150 MG PO TABS: 150.0000 mg | ORAL_TABLET | Freq: Once | ORAL | 0 refills | Status: AC

## 2023-07-18 MED ORDER — METRONIDAZOLE 0.75 % VA GEL: 1.0000 | Freq: Every day | VAGINAL | 0 refills | Status: AC

## 2023-07-18 NOTE — Addendum Note (Signed)
 Addended by: Darrell Jewel V on: 07/18/2023 03:50 PM   Modules accepted: Orders

## 2023-07-25 ENCOUNTER — Telehealth: Payer: Self-pay | Admitting: Obstetrics and Gynecology

## 2023-07-25 DIAGNOSIS — E1169 Type 2 diabetes mellitus with other specified complication: Secondary | ICD-10-CM

## 2023-07-25 NOTE — Telephone Encounter (Signed)
 Records received from prior OB/GYN at [facility] physicians: 03/21/23 NIL, HPV neg, CT positive > treated A1c 6.7% > PCP referral, should be on metformin 03/28/2023 [place]: uterus 8.3 x 5.7 x 5.8 cm, appears to have 2 horns with endometrium seen in left and right horn.  Difficult to get accurate measurements of either endometrium.  Simple 2.4 cm left ovarian cyst.  Referral sent for primary care provider Will discuss further with patient at follow-up appointment

## 2023-08-01 ENCOUNTER — Ambulatory Visit (HOSPITAL_BASED_OUTPATIENT_CLINIC_OR_DEPARTMENT_OTHER): Payer: Self-pay | Admitting: Student

## 2023-08-12 ENCOUNTER — Encounter: Payer: Self-pay | Admitting: Obstetrics and Gynecology

## 2023-08-12 ENCOUNTER — Ambulatory Visit (INDEPENDENT_AMBULATORY_CARE_PROVIDER_SITE_OTHER): Payer: No Typology Code available for payment source | Admitting: Obstetrics and Gynecology

## 2023-08-12 VITALS — BP 118/64 | HR 73 | Temp 98.3°F | Wt 288.0 lb

## 2023-08-12 DIAGNOSIS — Q519 Congenital malformation of uterus and cervix, unspecified: Secondary | ICD-10-CM

## 2023-08-12 DIAGNOSIS — E282 Polycystic ovarian syndrome: Secondary | ICD-10-CM | POA: Diagnosis not present

## 2023-08-12 DIAGNOSIS — R7989 Other specified abnormal findings of blood chemistry: Secondary | ICD-10-CM | POA: Diagnosis not present

## 2023-08-12 DIAGNOSIS — E221 Hyperprolactinemia: Secondary | ICD-10-CM | POA: Diagnosis not present

## 2023-08-12 NOTE — Progress Notes (Signed)
 28 y.o. 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  She also was lost to endocrine and PCP care. Hx of spine surgery in 2020. Presents with husband, Maureen Ralphs.  Patient's last menstrual period was 07/29/2023 (approximate). Period Duration (Days): 5 Period Pattern: Regular Menstrual Flow: Moderate Menstrual Control: Maxi pad Dysmenorrhea: None (lower back pain) Dysmenorrhea Symptoms: DiarrheaQ30-35d, more regular at this time  She was evaluated at [facility] last year due to right lower quadrant pain.  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  Her evaluation also was positive for chlamydia, diagnosed in November 2024.  Both her and her husband are s/p treatment completion.  She was also treated for BV.  SA: yes, have been trying frr >1 year  Chart review reveals last endocrinology appointment in February 28, 2021.  Patient was on 2 g of metformin daily and repeat labs are ordered.  They were considering cabergoline and clomid use pending prolactin level in setting of irregular periods. Testosterone 61 (highest was 84 01/25/2021) Prolactin 30.54 (highest 39 on 09/09/2020) DHEA-503 (highest was 793 on 09/09/2020) 17 OHP, cortisol was normal  02/22/2021 MRI pituitary was unremarkable. 06/08/2020 A1c 6.0  Approximate 20 pound weight gain since 2022.  03/28/2023 [place]: uterus 8.3 x 5.7 x 5.8 cm, appears to have 2 horns with endometrium seen in left and right horn.  Difficult to get accurate measurements of either endometrium.  Simple 2.4 cm left ovarian cyst.  (See media)  07/10/23 Normal hormonal evaluation: PRL, DHEA-s, testosterone,  estradiol  Recent Cycles (not doing ovulation test kits at home): 9/9-13 10/23-27 11/25-1 12/15-19 1/10-14 2/14-18 3/17-21  Treated for BV and yeast at last appt, doing well  GYN HISTORY: Miscarriage at 28yo, spontaneous  OB History  Gravida Para Term Preterm AB Living  1 0 0 0 1 0  SAB IAB Ectopic Multiple Live Births  1 0 0 0 0    # Outcome Date GA Lbr Len/2nd Weight Sex Type Anes PTL Lv  1 SAB             Past Medical History:  Diagnosis Date   Anemia    Phreesia 12/22/2019   Diabetes mellitus without complication [facility])     Past Surgical History:  Procedure Laterality Date   MICRODISCECTOMY LUMBAR  08/2018   SPINE SURGERY N/A    Phreesia 12/22/2019   WISDOM TOOTH EXTRACTION      Current Outpatient Medications on File Prior to Visit  Medication Sig Dispense Refill   Prenatal Vit-Fe Fumarate-FA (MULTIVITAMIN-PRENATAL) 27-0.8 MG TABS tablet Take 1 tablet by mouth daily at 12 noon.     [DISCONTINUED] gabapentin (NEURONTIN) 300 MG capsule Take 1 capsule (300 mg total) by mouth 3 (three) times daily. 90 capsule 3   [DISCONTINUED] omeprazole (PRILOSEC) 20 MG capsule Take 1 capsule (20 mg total) by mouth 2 (two) times daily before a meal. 60 capsule 0   No current facility-administered medications on file prior to visit.    No Known Allergies    PE Today's Vitals   08/12/23 1209  BP: 118/64  Pulse: 73  Temp: 98.3 F (36.8 C)  TempSrc: Oral  SpO2: 99%  Weight: 288 lb (130.6 kg)  Body mass index is 46.48 kg/m.  Physical Exam Vitals reviewed.  Constitutional:      General: She is not in acute distress.    Appearance: Normal appearance.  HENT:     Head: Normocephalic and atraumatic.     Nose: Nose normal.  Eyes:     Extraocular Movements: Extraocular movements intact.     Conjunctiva/sclera: Conjunctivae normal.  Pulmonary:     Effort: Pulmonary effort is normal.  Musculoskeletal:        General: Normal range of motion.     Cervical back:  Normal range of motion.  Neurological:     General: No focal deficit present.     Mental Status: She is alert.  Psychiatric:        Mood and Affect: Mood normal.        Behavior: Behavior normal.      Assessment and Plan:        Uterine anomaly -     Ambulatory referral to Infertility  Hyperprolactinemia (HCC) -     Ambulatory referral to Infertility  Elevated dehydroepiandrosterone (DHEA) level -     Ambulatory referral to Infertility  PCOS (polycystic ovarian syndrome) Assessment & Plan: Patient with known PCOS, hyperprolactinemia and elevated DHEA-S lost to endocrine follow-up. Lab trend from 2022 showed normal downtrending hormone levels without medical therapy. Normal labs in Feb 2025 Given improvement in cycles, PCOS seems to be well controlled Will refer to REI in setting of uterine anomaly and infertility for evaluation including HSG    Rosalyn Gess, MD

## 2023-08-12 NOTE — Assessment & Plan Note (Signed)
 Patient with known PCOS, hyperprolactinemia and elevated DHEA-S lost to endocrine follow-up. Lab trend from 2022 showed normal downtrending hormone levels without medical therapy. Normal labs in Feb 2025 Given improvement in cycles, PCOS seems to be well controlled Will refer to REI in setting of uterine anomaly and infertility for evaluation including HSG

## 2023-09-09 ENCOUNTER — Ambulatory Visit: Admitting: Nurse Practitioner

## 2023-10-14 ENCOUNTER — Other Ambulatory Visit: Payer: Self-pay

## 2023-10-14 ENCOUNTER — Other Ambulatory Visit (HOSPITAL_COMMUNITY): Payer: Self-pay

## 2023-10-14 MED ORDER — METFORMIN HCL 1000 MG PO TABS
ORAL_TABLET | ORAL | 11 refills | Status: AC
  Filled 2023-10-14: qty 60, 30d supply, fill #0
  Filled 2024-04-01: qty 60, 30d supply, fill #1

## 2023-10-14 MED ORDER — LETROZOLE 2.5 MG PO TABS
7.5000 mg | ORAL_TABLET | Freq: Every day | ORAL | 3 refills | Status: DC
  Filled 2023-10-14: qty 15, 5d supply, fill #0

## 2023-10-21 ENCOUNTER — Other Ambulatory Visit (HOSPITAL_COMMUNITY): Payer: Self-pay

## 2023-10-21 MED ORDER — VITAMIN D3 1.25 MG (50000 UT) PO CAPS
50000.0000 [IU] | ORAL_CAPSULE | ORAL | 0 refills | Status: AC
  Filled 2023-10-21: qty 8, 56d supply, fill #0

## 2023-10-25 ENCOUNTER — Other Ambulatory Visit (HOSPITAL_COMMUNITY): Payer: Self-pay

## 2023-11-01 ENCOUNTER — Telehealth (HOSPITAL_BASED_OUTPATIENT_CLINIC_OR_DEPARTMENT_OTHER): Payer: Self-pay | Admitting: Obstetrics and Gynecology

## 2023-11-01 NOTE — Telephone Encounter (Signed)
 Records received from [facility] consultation with Dr. Fernande Howells at [facility] on 10/14/2023.  Ultrasound revealed normal uterine shape.  Plan for: HSG and semen analysis Lab analysis Weight loss with metformin  1 g twice daily and initiation of Ozempic (pause 1 week after confirmed ovulation).  Goal 5 to 10% of current body weight. Letrozole  +12-day ultrasound (5% risk of twins).

## 2023-12-16 ENCOUNTER — Other Ambulatory Visit: Payer: Self-pay | Admitting: Physician Assistant

## 2023-12-16 DIAGNOSIS — R519 Headache, unspecified: Secondary | ICD-10-CM

## 2023-12-17 ENCOUNTER — Ambulatory Visit
Admission: RE | Admit: 2023-12-17 | Discharge: 2023-12-17 | Disposition: A | Discharge status: Home / Self Care | Source: Ambulatory Visit | Attending: Physician Assistant | Admitting: Physician Assistant

## 2023-12-17 DIAGNOSIS — R519 Headache, unspecified: Secondary | ICD-10-CM

## 2023-12-17 MED ORDER — GADOPICLENOL 0.5 MMOL/ML IV SOLN
10.0000 mL | Freq: Once | INTRAVENOUS | Status: AC | PRN
  Administered 2023-12-17: 10 mL via INTRAVENOUS

## 2024-01-09 ENCOUNTER — Ambulatory Visit (INDEPENDENT_AMBULATORY_CARE_PROVIDER_SITE_OTHER): Payer: Self-pay | Admitting: Obstetrics and Gynecology

## 2024-01-09 ENCOUNTER — Encounter: Payer: Self-pay | Admitting: Obstetrics and Gynecology

## 2024-01-09 VITALS — BP 110/78 | HR 78 | Temp 98.1°F | Ht 66.75 in | Wt 272.0 lb

## 2024-01-09 DIAGNOSIS — E282 Polycystic ovarian syndrome: Secondary | ICD-10-CM

## 2024-01-09 DIAGNOSIS — N840 Polyp of corpus uteri: Secondary | ICD-10-CM | POA: Diagnosis not present

## 2024-01-09 DIAGNOSIS — E221 Hyperprolactinemia: Secondary | ICD-10-CM | POA: Diagnosis not present

## 2024-01-09 DIAGNOSIS — N970 Female infertility associated with anovulation: Secondary | ICD-10-CM

## 2024-01-09 DIAGNOSIS — Z148 Genetic carrier of other disease: Secondary | ICD-10-CM | POA: Insufficient documentation

## 2024-01-09 DIAGNOSIS — N841 Polyp of cervix uteri: Secondary | ICD-10-CM | POA: Insufficient documentation

## 2024-01-09 DIAGNOSIS — E119 Type 2 diabetes mellitus without complications: Secondary | ICD-10-CM | POA: Diagnosis not present

## 2024-01-09 DIAGNOSIS — D352 Benign neoplasm of pituitary gland: Secondary | ICD-10-CM | POA: Insufficient documentation

## 2024-01-09 DIAGNOSIS — Z7985 Long-term (current) use of injectable non-insulin antidiabetic drugs: Secondary | ICD-10-CM

## 2024-01-09 NOTE — Progress Notes (Signed)
 28 y.o. G28P0010 female with history of PCOS, hyperprolactinemia, and elevated DHEA-S (followed by endocrine), recent type 2 diabetes, morbid obesity diagnosis here for surgical consultation uterine polyps. 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 She recently lost her medical insurance and had to switch GYN provider, prev seen at [facility]  She also was lost to endocrine and PCP care. Hx of spine surgery in 2020.   Patient's last menstrual period was 12/29/2023 (approximate). Period Duration (Days): 4-5 Period Pattern: Regular Menstrual Flow: Moderate Menstrual Control: Maxi pad Dysmenorrhea: (!) Mild Dysmenorrhea Symptoms: Diarrhea Q30-35d, more regular at this time  Referred to [facility], Dr. 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  No CP or SOB.  GYN HISTORY: Miscarriage at 28yo, spontaneous Chlamydia, 2024 PCOS Pituitary microadenoma, hyperprolactinemia Elevated DHEA  OB History  Gravida Para Term Preterm AB Living  1 0 0 0 1 0  SAB IAB Ectopic Multiple Live Births  1 0 0 0 0    # Outcome Date GA Lbr Len/2nd Weight Sex Type Anes PTL Lv  1 SAB             Past Medical History:  Diagnosis Date   Anemia    Phreesia 12/22/2019   Diabetes mellitus without complication (HCC)    Prolactinoma (HCC) 2025    Past Surgical History:  Procedure Laterality Date   MICRODISCECTOMY LUMBAR  08/2018   SPINE SURGERY N/A    Phreesia 12/22/2019   WISDOM  TOOTH EXTRACTION      Current Outpatient Medications on File Prior to Visit  Medication Sig Dispense Refill   metFORMIN  (GLUCOPHAGE ) 1000 MG tablet 1 tablet with a meal Orally Once a day     Omega 3-6-9 CAPS as directed Orally twice a day     OZEMPIC, 0.25 OR 0.5 MG/DOSE, 2 MG/3ML SOPN INJECT 0.5MG  SUBCUTANEOUSLY ONCE A WEEK; Duration: 28 days     Prenatal Vit-Fe Fumarate-FA (MULTIVITAMIN-PRENATAL) 27-0.8 MG TABS tablet Take 1 tablet by mouth daily at 12 noon.     Cholecalciferol  (VITAMIN D3) 1.25 MG (50000 UT) CAPS Take 1 capsule (50,000 Units total) by mouth once a week. (Patient not taking: Reported on 01/09/2024) 8 capsule 0   [DISCONTINUED] gabapentin  (NEURONTIN ) 300 MG capsule Take 1 capsule (300 mg total) by mouth 3 (three) times daily. 90 capsule 3   [DISCONTINUED] omeprazole  (PRILOSEC) 20 MG capsule Take 1 capsule (20 mg total) by mouth 2 (two) times daily before a meal. 60 capsule 0   No current facility-administered medications on file prior to visit.    No Known Allergies    PE Today's Vitals   01/09/24 1205  BP: 110/78  Pulse: 78  Temp: 98.1 F (36.7 C)  TempSrc: Oral  SpO2: 99%  Weight: 272 lb (123.4 kg)  Height: 5' 6.75 (1.695 m)   Body mass index is 42.92 kg/m.  Physical Exam Vitals reviewed. Exam conducted with a chaperone present.  Constitutional:      General: She is not in acute distress.  Appearance: Normal appearance.  HENT:     Head: Normocephalic and atraumatic.     Nose: Nose normal.  Eyes:     Extraocular Movements: Extraocular movements intact.     Conjunctiva/sclera: Conjunctivae normal.  Pulmonary:     Effort: Pulmonary effort is normal.  Genitourinary:    General: Normal vulva.     Exam position: Lithotomy position.     Vagina: Normal. No vaginal discharge.     Cervix: Normal. No cervical motion tenderness, discharge or lesion.     Uterus: Normal. Not enlarged and not tender.      Adnexa: Right adnexa normal and left adnexa normal.   Musculoskeletal:        General: Normal range of motion.     Cervical back: Normal range of motion.  Neurological:     General: No focal deficit present.     Mental Status: She is alert.  Psychiatric:        Mood and Affect: Mood normal.        Behavior: Behavior normal.      Assessment and Plan:        Endocervical polyp Assessment & Plan: Plan for hysteroscopy, dilation and curettage, polypectomy.  Discussed outpatient procedure. Reviewed that  recovery is usually 1-2 days. Risks including infections, bleeding, and damage to surrounding organs reviewed. Recommend NPO prior to midnight and reviewed medication to take on day of surgery. Dicussed use of NSAIDS as needed for pain postoperatively.  Preop checklist: Antibiotics: none DVT ppx: SCDs Postop visit: 1 week Additional clearance: none    Orders: -     Ambulatory Referral For Surgery Scheduling  Infertility associated with anovulation  PCOS (polycystic ovarian syndrome)  Type 2 diabetes mellitus without complication, without long-term current use of insulin (HCC)  Infertility- plan for BMI < 40, then will return for infertility treatments at [facility] Continue endocrine care with Dr. Braulio GRIFFINS for preop  Vera LULLA Pa, MD

## 2024-01-09 NOTE — Assessment & Plan Note (Signed)
 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

## 2024-01-09 NOTE — Patient Instructions (Addendum)
 Preoperative instructions: Nothing to eat or drink after midnight, unless instructed differently regarding clear liquids by the anesthesia team at [facility]. Do not take any medications on the day of surgery, except those listed below: NONE Stop ozempic 2 weeks prior to procedure Please follow all other instructions as provided by our surgical scheduler at [facility] and the anesthesia team at [facility].  Postoperative instructions: Take ibuprofen  as prescribed and over-the-counter Tylenol  as needed.

## 2024-01-10 IMAGING — CR DG CHEST 2V
3 series · 3 of 3 positions shown · non-contrast
Comparison: None Available.

CLINICAL DATA: Morbid obesity.  Preop bariatric surgery

EXAM:
CHEST - 2 VIEW

[w chest pa (1 of 2)]
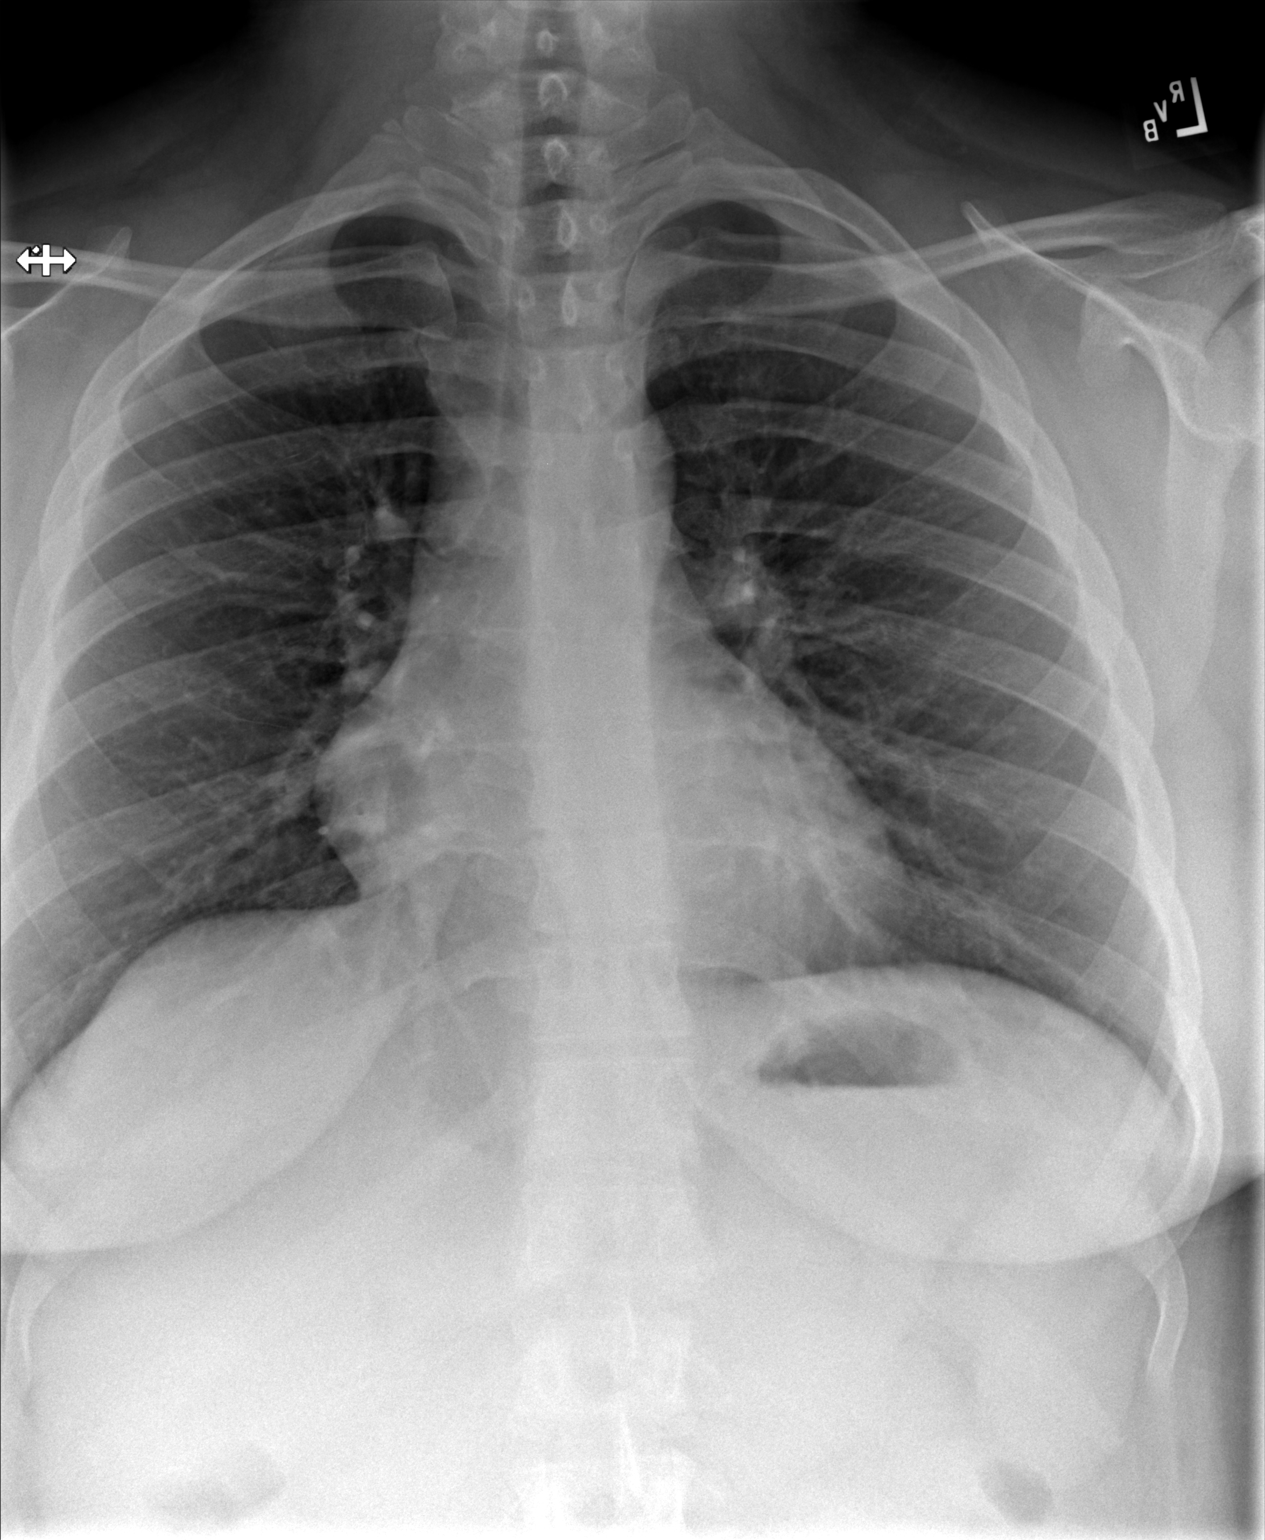

[w chest pa (2 of 2)]
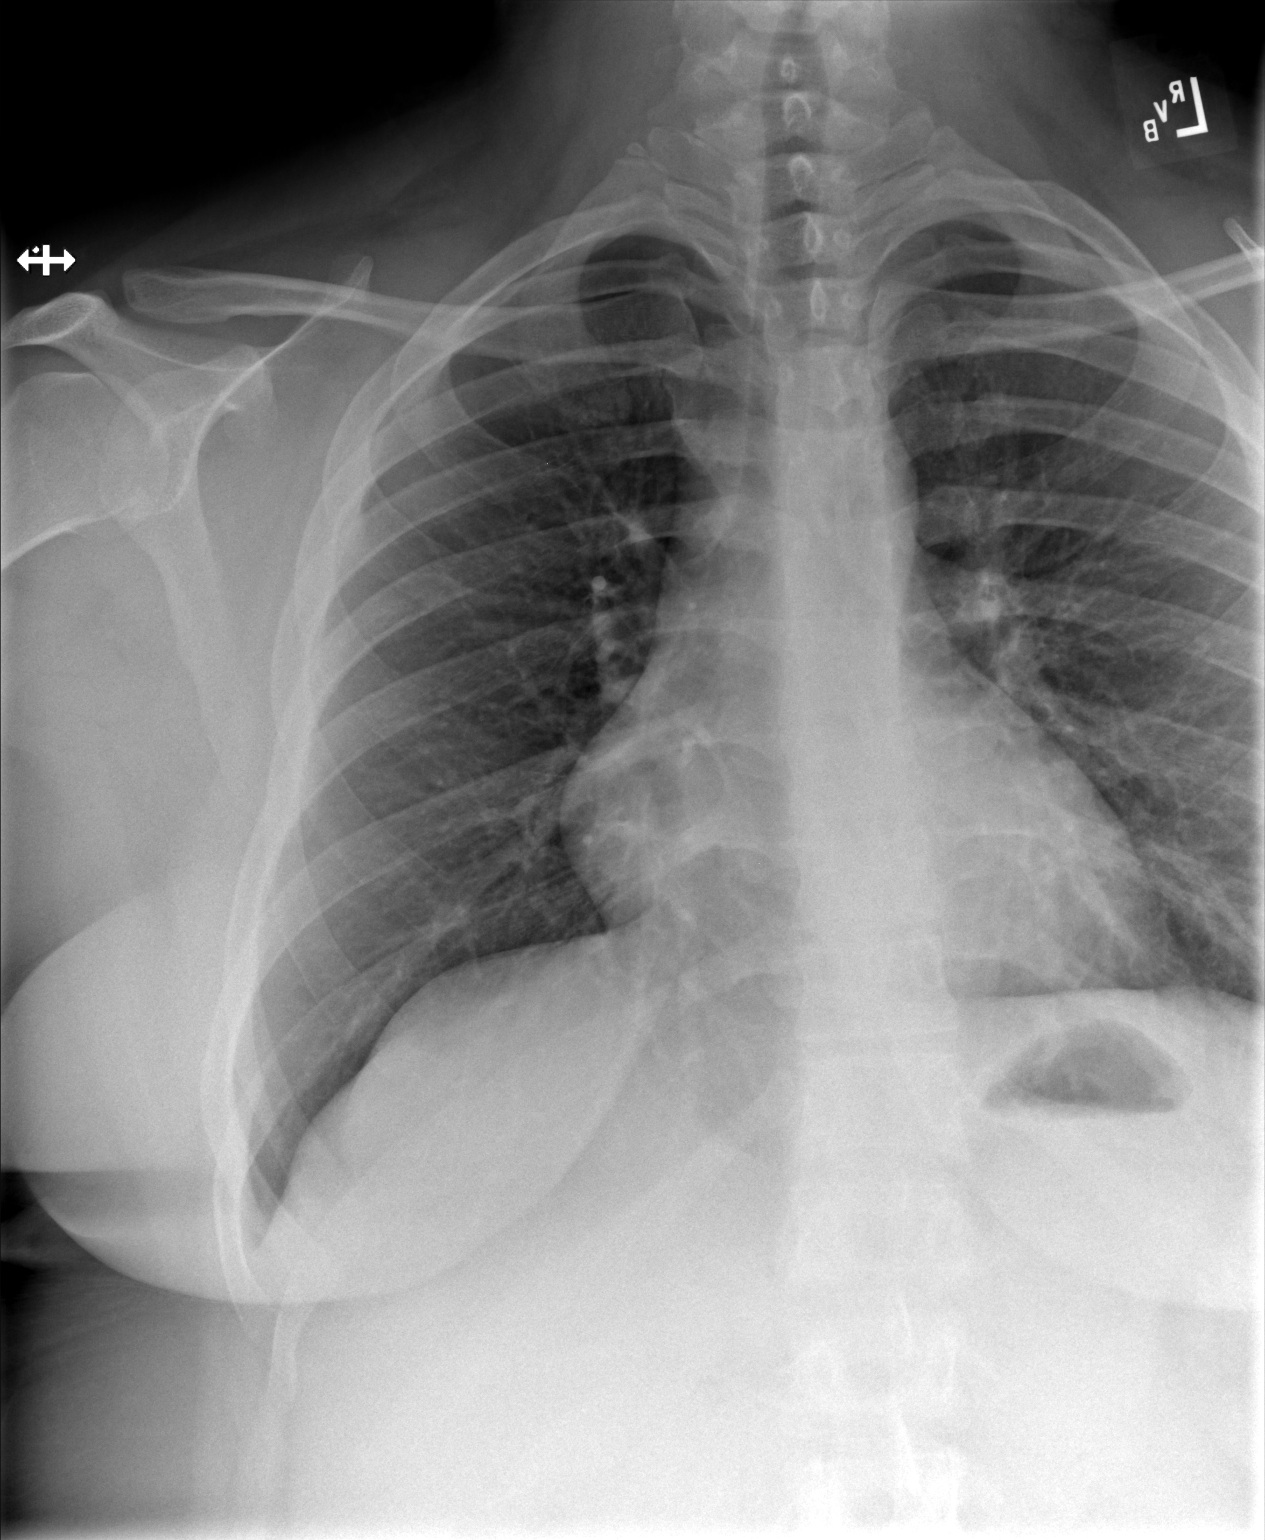

[w chest lat]
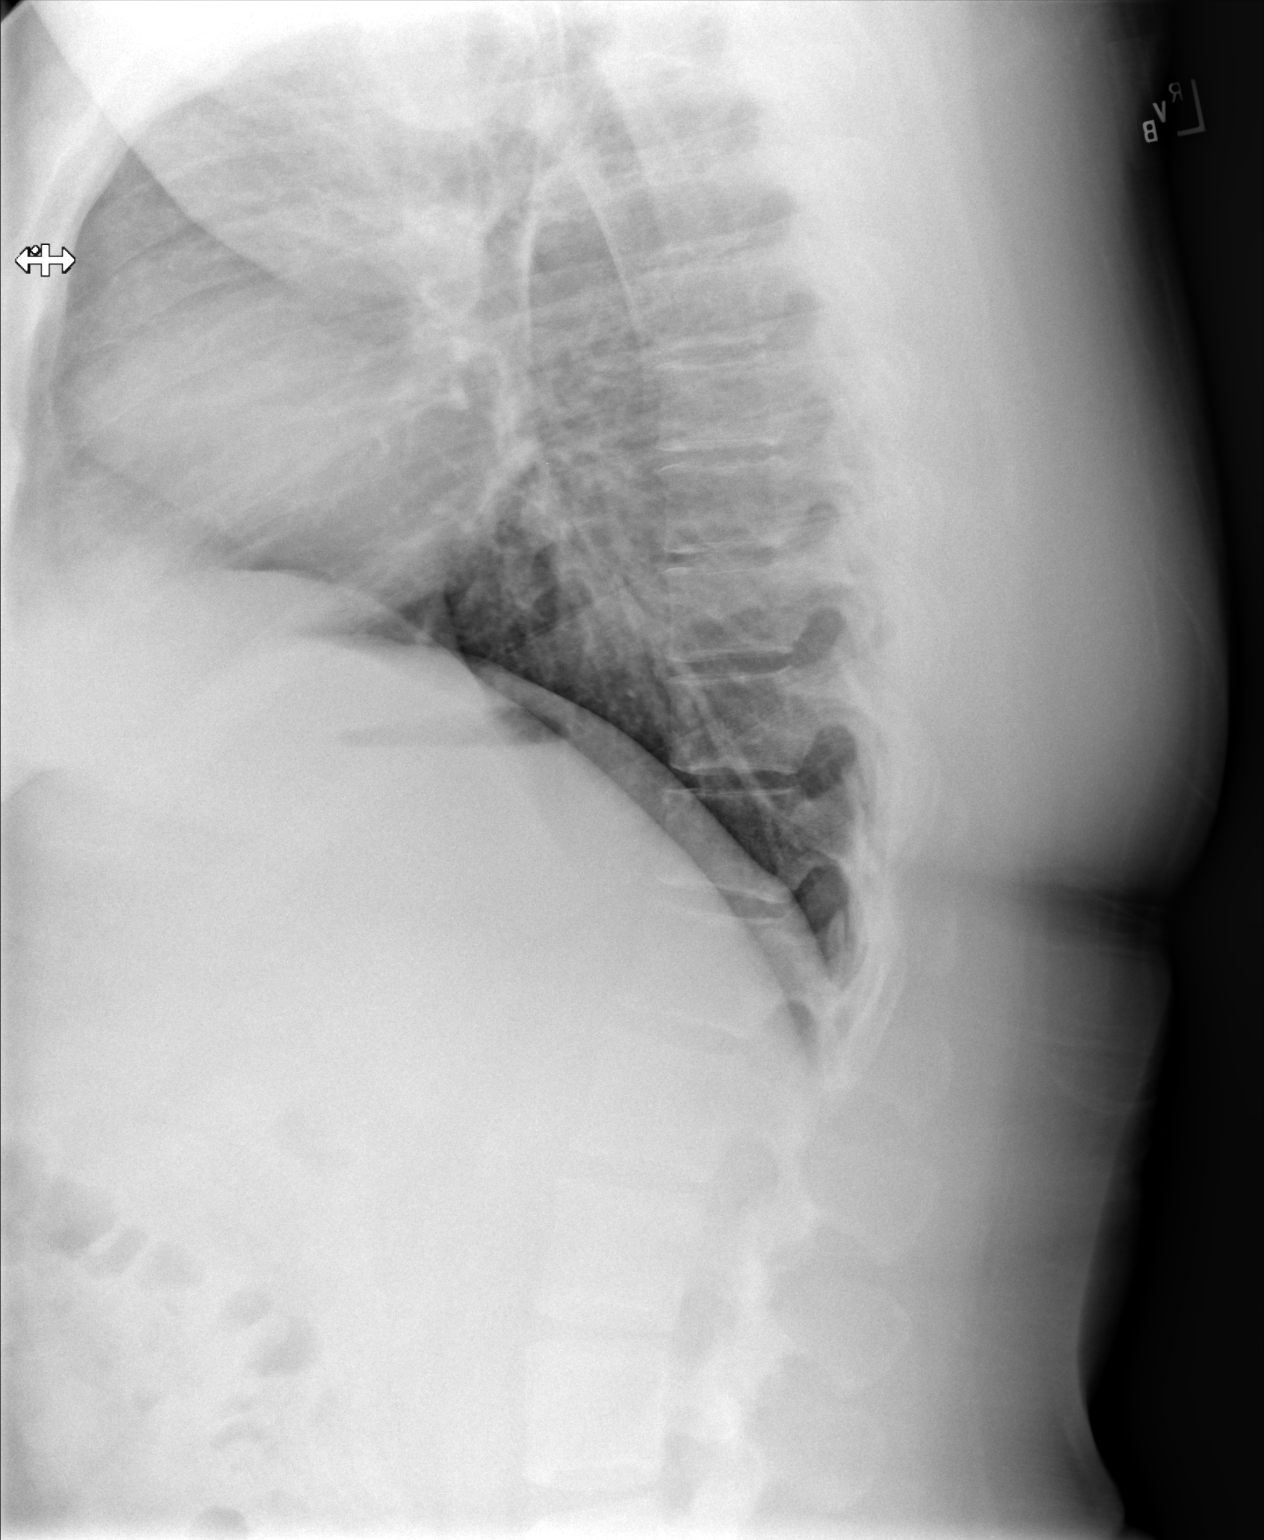

[3 of 3 positions shown; findings below may reference images not displayed]

FINDINGS: The heart is normal in size.The cardiomediastinal contours are
normal. The lungs are clear. Pulmonary vasculature is normal. No
consolidation, pleural effusion, or pneumothorax. No acute osseous
abnormalities are seen.
IMPRESSION: Negative radiographs of the chest.

## 2024-01-14 NOTE — Assessment & Plan Note (Signed)
 Plan for hysteroscopy, dilation and curettage, polypectomy.  Discussed outpatient procedure. Reviewed that  recovery is usually 1-2 days. Risks including infections, bleeding, and damage to surrounding organs reviewed. Recommend NPO prior to midnight and reviewed medication to take on day of surgery. Dicussed use of NSAIDS as needed for pain postoperatively.  Preop checklist: Antibiotics: none DVT ppx: SCDs Postop visit: 1 week Additional clearance: none

## 2024-01-16 NOTE — H&P (View-Only) (Signed)
 28 y.o. G47P0010 female with history of PCOS, hyperprolactinemia/pituitary microadenoma, and elevated DHEA-S (followed by endocrine), recent type 2 diabetes, morbid obesity here for preop visit: dx hyst, D&C, polypectomy scheduled 01/31/24. 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  She also was lost to endocrine and PCP care. Hx of spine surgery in 2020.   Patient's last menstrual period was 12/29/2023 (approximate).   Q30-35d, more regular at this time  Referred to REI, Dr. 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  No CP or SOB.  GYN HISTORY: Miscarriage at 28yo, spontaneous Chlamydia, 2024 PCOS Pituitary microadenoma, hyperprolactinemia Elevated DHEA  OB History  Gravida Para Term Preterm AB Living  1 0 0 0 1 0  SAB IAB Ectopic Multiple Live Births  1 0 0 0 0    # Outcome Date GA Lbr Len/2nd Weight Sex Type Anes PTL Lv  1 SAB             Past Medical History:  Diagnosis Date   Anemia    Phreesia 12/22/2019   Diabetes mellitus without complication (HCC)    Prolactinoma (HCC) 2025    Past Surgical History:  Procedure Laterality Date   MICRODISCECTOMY LUMBAR  08/2018   SPINE SURGERY N/A    Phreesia 12/22/2019   WISDOM TOOTH EXTRACTION      Current Outpatient Medications on File Prior to Visit  Medication Sig Dispense Refill   Cholecalciferol   (VITAMIN D3) 1.25 MG (50000 UT) CAPS Take 1 capsule (50,000 Units total) by mouth once a week. 8 capsule 0   metFORMIN  (GLUCOPHAGE ) 1000 MG tablet 1 tablet with a meal Orally Once a day     Omega 3-6-9 CAPS as directed Orally twice a day     OZEMPIC, 0.25 OR 0.5 MG/DOSE, 2 MG/3ML SOPN INJECT 0.5MG  SUBCUTANEOUSLY ONCE A WEEK; Duration: 28 days     Prenatal Vit-Fe Fumarate-FA (MULTIVITAMIN-PRENATAL) 27-0.8 MG TABS tablet Take 1 tablet by mouth daily at 12 noon.     [DISCONTINUED] gabapentin  (NEURONTIN ) 300 MG capsule Take 1 capsule (300 mg total) by mouth 3 (three) times daily. 90 capsule 3   [DISCONTINUED] omeprazole  (PRILOSEC) 20 MG capsule Take 1 capsule (20 mg total) by mouth 2 (two) times daily before a meal. 60 capsule 0   No current facility-administered medications on file prior to visit.    No Known Allergies    PE Today's Vitals   01/20/24 1554  BP: 98/60  Pulse: 83  Temp: 98.3 F (36.8 C)  TempSrc: Oral  SpO2: 99%  Weight: 277 lb (125.6 kg)   Body mass index is 43.71 kg/m.  Physical Exam Vitals reviewed.  Constitutional:      General: She is not in acute distress.    Appearance: Normal appearance.  HENT:     Head: Normocephalic and atraumatic.     Nose: Nose normal.  Eyes:     Extraocular Movements: Extraocular movements  intact.     Conjunctiva/sclera: Conjunctivae normal.  Cardiovascular:     Rate and Rhythm: Normal rate and regular rhythm.     Heart sounds: No murmur heard.    No friction rub. No gallop.  Pulmonary:     Effort: Pulmonary effort is normal. No respiratory distress.     Breath sounds: Normal breath sounds. No stridor. No wheezing, rhonchi or rales.  Musculoskeletal:        General: Normal range of motion.     Cervical back: Normal range of motion.  Neurological:     General: No focal deficit present.     Mental Status: She is alert.  Psychiatric:        Mood and Affect: Mood normal.        Behavior: Behavior normal.      Assessment and  Plan:        Uterine polyp Assessment & Plan: Plan for hysteroscopy, dilation and curettage, polypectomy.  Discussed outpatient procedure. Reviewed that  recovery is usually 1-2 days. Risks including infections, bleeding, and damage to surrounding organs reviewed. Recommend NPO prior to midnight and reviewed medication to take on day of surgery. Dicussed use of NSAIDS as needed for pain postoperatively.  Preop checklist: Antibiotics: none DVT ppx: SCDs Postop visit: 1 week Additional clearance: none     Vera LULLA Pa, MD

## 2024-01-16 NOTE — Progress Notes (Signed)
 28 y.o. G69P0010 female with history of PCOS, hyperprolactinemia/pituitary microadenoma, and elevated DHEA-S (followed by endocrine), recent type 2 diabetes, morbid obesity here for preop visit: dx hyst, D&C, polypectomy scheduled 01/31/24. 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  She also was lost to endocrine and PCP care. Hx of spine surgery in 2020.   Patient's last menstrual period was 12/29/2023 (approximate).   Q30-35d, more regular at this time  Referred to REI, Dr. 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  No CP or SOB.  GYN HISTORY: Miscarriage at 28yo, spontaneous Chlamydia, 2024 PCOS Pituitary microadenoma, hyperprolactinemia Elevated DHEA  OB History  Gravida Para Term Preterm AB Living  1 0 0 0 1 0  SAB IAB Ectopic Multiple Live Births  1 0 0 0 0    # Outcome Date GA Lbr Len/2nd Weight Sex Type Anes PTL Lv  1 SAB             Past Medical History:  Diagnosis Date   Anemia    Phreesia 12/22/2019   Diabetes mellitus without complication (HCC)    Prolactinoma (HCC) 2025    Past Surgical History:  Procedure Laterality Date   MICRODISCECTOMY LUMBAR  08/2018   SPINE SURGERY N/A    Phreesia 12/22/2019   WISDOM TOOTH EXTRACTION      Current Outpatient Medications on File Prior to Visit  Medication Sig Dispense Refill   Cholecalciferol   (VITAMIN D3) 1.25 MG (50000 UT) CAPS Take 1 capsule (50,000 Units total) by mouth once a week. 8 capsule 0   metFORMIN  (GLUCOPHAGE ) 1000 MG tablet 1 tablet with a meal Orally Once a day     Omega 3-6-9 CAPS as directed Orally twice a day     OZEMPIC, 0.25 OR 0.5 MG/DOSE, 2 MG/3ML SOPN INJECT 0.5MG  SUBCUTANEOUSLY ONCE A WEEK; Duration: 28 days     Prenatal Vit-Fe Fumarate-FA (MULTIVITAMIN-PRENATAL) 27-0.8 MG TABS tablet Take 1 tablet by mouth daily at 12 noon.     [DISCONTINUED] gabapentin  (NEURONTIN ) 300 MG capsule Take 1 capsule (300 mg total) by mouth 3 (three) times daily. 90 capsule 3   [DISCONTINUED] omeprazole  (PRILOSEC) 20 MG capsule Take 1 capsule (20 mg total) by mouth 2 (two) times daily before a meal. 60 capsule 0   No current facility-administered medications on file prior to visit.    No Known Allergies    PE Today's Vitals   01/20/24 1554  BP: 98/60  Pulse: 83  Temp: 98.3 F (36.8 C)  TempSrc: Oral  SpO2: 99%  Weight: 277 lb (125.6 kg)   Body mass index is 43.71 kg/m.  Physical Exam Vitals reviewed.  Constitutional:      General: She is not in acute distress.    Appearance: Normal appearance.  HENT:     Head: Normocephalic and atraumatic.     Nose: Nose normal.  Eyes:     Extraocular Movements: Extraocular movements  intact.     Conjunctiva/sclera: Conjunctivae normal.  Cardiovascular:     Rate and Rhythm: Normal rate and regular rhythm.     Heart sounds: No murmur heard.    No friction rub. No gallop.  Pulmonary:     Effort: Pulmonary effort is normal. No respiratory distress.     Breath sounds: Normal breath sounds. No stridor. No wheezing, rhonchi or rales.  Musculoskeletal:        General: Normal range of motion.     Cervical back: Normal range of motion.  Neurological:     General: No focal deficit present.     Mental Status: She is alert.  Psychiatric:        Mood and Affect: Mood normal.        Behavior: Behavior normal.      Assessment and  Plan:        Uterine polyp Assessment & Plan: Plan for hysteroscopy, dilation and curettage, polypectomy.  Discussed outpatient procedure. Reviewed that  recovery is usually 1-2 days. Risks including infections, bleeding, and damage to surrounding organs reviewed. Recommend NPO prior to midnight and reviewed medication to take on day of surgery. Dicussed use of NSAIDS as needed for pain postoperatively.  Preop checklist: Antibiotics: none DVT ppx: SCDs Postop visit: 1 week Additional clearance: none     Vera LULLA Pa, MD

## 2024-01-20 ENCOUNTER — Encounter: Payer: Self-pay | Admitting: Obstetrics and Gynecology

## 2024-01-20 ENCOUNTER — Ambulatory Visit (INDEPENDENT_AMBULATORY_CARE_PROVIDER_SITE_OTHER): Admitting: Obstetrics and Gynecology

## 2024-01-20 ENCOUNTER — Encounter: Payer: Self-pay | Admitting: *Deleted

## 2024-01-20 VITALS — BP 98/60 | HR 83 | Temp 98.3°F | Wt 277.0 lb

## 2024-01-20 DIAGNOSIS — N840 Polyp of corpus uteri: Secondary | ICD-10-CM

## 2024-01-20 NOTE — Assessment & Plan Note (Signed)
 Plan for hysteroscopy, dilation and curettage, polypectomy.  Discussed outpatient procedure. Reviewed that  recovery is usually 1-2 days. Risks including infections, bleeding, and damage to surrounding organs reviewed. Recommend NPO prior to midnight and reviewed medication to take on day of surgery. Dicussed use of NSAIDS as needed for pain postoperatively.  Preop checklist: Antibiotics: none DVT ppx: SCDs Postop visit: 1 week Additional clearance: none

## 2024-01-20 NOTE — Patient Instructions (Signed)
 Preoperative instructions: Nothing to eat or drink after midnight, unless instructed differently regarding clear liquids by the anesthesia team at [facility]. Do not take any medications on the day of surgery, except those listed below: NONE Stop ozempic 2 weeks prior to procedure Please follow all other instructions as provided by our surgical scheduler at [facility] and the anesthesia team at [facility].  Postoperative instructions: Take ibuprofen  as prescribed and over-the-counter Tylenol  as needed.

## 2024-01-27 ENCOUNTER — Encounter (HOSPITAL_COMMUNITY): Payer: Self-pay | Admitting: Obstetrics and Gynecology

## 2024-01-27 ENCOUNTER — Encounter (HOSPITAL_COMMUNITY): Payer: Self-pay

## 2024-01-27 NOTE — Progress Notes (Signed)
 Spoke w/ via phone for pre-op interview--- Aimee [name] needs dos---- UPT, CBG, BMP, A1C and EKG per anesthesia. Surgeon orders requested 01/27/24.        [name] results------ COVID test -----patient states asymptomatic no test needed Arrive at ------- 0900 NPO after MN NO Solid Food.  Clear liquids from MN until---0800 Pre-Surgery Ensure or G2:  Med rec completed Medications to take morning of surgery -----NONE Diabetic medication ----- NONE AM of surgery  GLP1 agonist last dose: Wegovy last dose 01/20/24 GLP1 instructions: Hold any further doses until after surgery, patient verbalized understanding.  Patient instructed no nail polish to be worn day of surgery Patient instructed to bring photo id and insurance card day of surgery Patient aware to have Driver (ride ) / caregiver    for 24 hours after surgery - Mother Aimee [name] Patient Special Instructions ----- Pre-Op special Instructions -----  Patient verbalized understanding of instructions that were given at this phone interview. Patient denies chest pain, sob, fever, cough at the interview.

## 2024-01-31 ENCOUNTER — Encounter (HOSPITAL_COMMUNITY): Payer: Self-pay | Admitting: Obstetrics and Gynecology

## 2024-01-31 ENCOUNTER — Ambulatory Visit (HOSPITAL_BASED_OUTPATIENT_CLINIC_OR_DEPARTMENT_OTHER)

## 2024-01-31 ENCOUNTER — Ambulatory Visit (HOSPITAL_COMMUNITY)

## 2024-01-31 ENCOUNTER — Ambulatory Visit (HOSPITAL_COMMUNITY)
Admission: RE | Admit: 2024-01-31 | Discharge: 2024-01-31 | Disposition: A | Discharge status: Home / Self Care | Attending: Obstetrics and Gynecology | Admitting: Obstetrics and Gynecology

## 2024-01-31 ENCOUNTER — Encounter (HOSPITAL_COMMUNITY)
Admission: RE | Disposition: A | Discharge status: Home / Self Care | Payer: Self-pay | Source: Home / Self Care | Attending: Obstetrics and Gynecology

## 2024-01-31 ENCOUNTER — Other Ambulatory Visit: Payer: Self-pay

## 2024-01-31 DIAGNOSIS — Z7985 Long-term (current) use of injectable non-insulin antidiabetic drugs: Secondary | ICD-10-CM | POA: Insufficient documentation

## 2024-01-31 DIAGNOSIS — N840 Polyp of corpus uteri: Secondary | ICD-10-CM | POA: Diagnosis not present

## 2024-01-31 DIAGNOSIS — Z79899 Other long term (current) drug therapy: Secondary | ICD-10-CM | POA: Diagnosis not present

## 2024-01-31 DIAGNOSIS — N841 Polyp of cervix uteri: Secondary | ICD-10-CM | POA: Diagnosis present

## 2024-01-31 DIAGNOSIS — E282 Polycystic ovarian syndrome: Secondary | ICD-10-CM | POA: Insufficient documentation

## 2024-01-31 DIAGNOSIS — Z6841 Body Mass Index (BMI) 40.0 and over, adult: Secondary | ICD-10-CM | POA: Diagnosis not present

## 2024-01-31 DIAGNOSIS — Z7984 Long term (current) use of oral hypoglycemic drugs: Secondary | ICD-10-CM | POA: Insufficient documentation

## 2024-01-31 DIAGNOSIS — E119 Type 2 diabetes mellitus without complications: Secondary | ICD-10-CM | POA: Diagnosis not present

## 2024-01-31 HISTORY — PX: DILATATION & CURRETTAGE/HYSTEROSCOPY WITH RESECTOCOPE: SHX5572

## 2024-01-31 HISTORY — PX: MYOSURE RESECTION: SHX7611

## 2024-01-31 LAB — BASIC METABOLIC PANEL WITH GFR
Anion gap: 10 (ref 5–15)
BUN: 10 mg/dL (ref 6–20)
CO2: 19 mmol/L — ABNORMAL LOW (ref 22–32)
Calcium: 8.4 mg/dL — ABNORMAL LOW (ref 8.9–10.3)
Chloride: 108 mmol/L (ref 98–111)
Creatinine, Ser: 0.65 mg/dL (ref 0.44–1.00)
GFR, Estimated: >60 mL/min (ref >60)
Glucose, Bld: 103 mg/dL — ABNORMAL HIGH (ref 70–99)
Potassium: 4.2 mmol/L (ref 3.5–5.1)
Sodium: 137 mmol/L (ref 135–145)

## 2024-01-31 LAB — HEMOGLOBIN A1C
Hgb A1c MFr Bld: 5.6 % (ref 4.8–5.6)
Mean Plasma Glucose: 114.02 mg/dL

## 2024-01-31 LAB — GLUCOSE, CAPILLARY
Glucose-Capillary: 99 mg/dL (ref 70–99)
Glucose-Capillary: 98 mg/dL (ref 70–99)

## 2024-01-31 LAB — POCT PREGNANCY, URINE: Preg Test, Ur: NEGATIVE

## 2024-01-31 SURGERY — DILATATION & CURETTAGE/HYSTEROSCOPY WITH RESECTOCOPE
Anesthesia: Monitor Anesthesia Care

## 2024-01-31 MED ORDER — CELECOXIB 200 MG PO CAPS
ORAL_CAPSULE | ORAL | Status: AC
  Filled 2024-01-31: qty 2

## 2024-01-31 MED ORDER — LIDOCAINE 2% (20 MG/ML) 5 ML SYRINGE
INTRAMUSCULAR | Status: AC
  Filled 2024-01-31: qty 5

## 2024-01-31 MED ORDER — ONDANSETRON HCL 4 MG/2ML IJ SOLN
INTRAMUSCULAR | Status: DC | PRN
  Administered 2024-01-31: 4 mg via INTRAVENOUS

## 2024-01-31 MED ORDER — LIDOCAINE HCL (PF) 1 % IJ SOLN
INTRAMUSCULAR | Status: DC | PRN
  Administered 2024-01-31: 10 mL

## 2024-01-31 MED ORDER — CHLORHEXIDINE GLUCONATE 0.12 % MT SOLN
OROMUCOSAL | Status: AC
  Filled 2024-01-31: qty 15

## 2024-01-31 MED ORDER — PROPOFOL 10 MG/ML IV BOLUS
INTRAVENOUS | Status: AC
Start: 2024-01-31 — End: 2024-01-31
  Filled 2024-01-31: qty 20

## 2024-01-31 MED ORDER — LACTATED RINGERS IV SOLN: INTRAVENOUS | Status: DC

## 2024-01-31 MED ORDER — PROPOFOL 10 MG/ML IV BOLUS
INTRAVENOUS | Status: DC | PRN
  Administered 2024-01-31: 120 mg via INTRAVENOUS

## 2024-01-31 MED ORDER — ORAL CARE MOUTH RINSE: 15.0000 mL | Freq: Once | OROMUCOSAL | Status: AC

## 2024-01-31 MED ORDER — ACETAMINOPHEN 500 MG PO TABS
ORAL_TABLET | ORAL | Status: AC
  Filled 2024-01-31: qty 2

## 2024-01-31 MED ORDER — PHENYLEPHRINE 80 MCG/ML (10ML) SYRINGE FOR IV PUSH (FOR BLOOD PRESSURE SUPPORT)
PREFILLED_SYRINGE | INTRAVENOUS | Status: AC
  Filled 2024-01-31: qty 10

## 2024-01-31 MED ORDER — ACETAMINOPHEN 500 MG PO TABS
1000.0000 mg | ORAL_TABLET | ORAL | Status: AC
Start: 2024-01-31 — End: 2024-01-31
  Administered 2024-01-31: 1000 mg via ORAL

## 2024-01-31 MED ORDER — LIDOCAINE 2% (20 MG/ML) 5 ML SYRINGE
INTRAMUSCULAR | Status: DC | PRN
Start: 2024-01-31 — End: 2024-01-31
  Administered 2024-01-31: 100 mg via INTRAVENOUS

## 2024-01-31 MED ORDER — DEXAMETHASONE SODIUM PHOSPHATE 10 MG/ML IJ SOLN
INTRAMUSCULAR | Status: DC | PRN
Start: 2024-01-31 — End: 2024-01-31
  Administered 2024-01-31: 5 mg via INTRAVENOUS

## 2024-01-31 MED ORDER — PHENYLEPHRINE 80 MCG/ML (10ML) SYRINGE FOR IV PUSH (FOR BLOOD PRESSURE SUPPORT)
PREFILLED_SYRINGE | INTRAVENOUS | Status: DC | PRN
Start: 2024-01-31 — End: 2024-01-31
  Administered 2024-01-31: 80 ug via INTRAVENOUS

## 2024-01-31 MED ORDER — DEXAMETHASONE SODIUM PHOSPHATE 10 MG/ML IJ SOLN
INTRAMUSCULAR | Status: AC
  Filled 2024-01-31: qty 1

## 2024-01-31 MED ORDER — FENTANYL CITRATE (PF) 250 MCG/5ML IJ SOLN
INTRAMUSCULAR | Status: DC | PRN
  Administered 2024-01-31 (×2): 50 ug via INTRAVENOUS

## 2024-01-31 MED ORDER — PROPOFOL 10 MG/ML IV BOLUS: INTRAVENOUS | Status: DC | PRN

## 2024-01-31 MED ORDER — ONDANSETRON HCL 4 MG/2ML IJ SOLN
INTRAMUSCULAR | Status: AC
  Filled 2024-01-31: qty 2

## 2024-01-31 MED ORDER — KETOROLAC TROMETHAMINE 30 MG/ML IJ SOLN
INTRAMUSCULAR | Status: AC
  Filled 2024-01-31: qty 1

## 2024-01-31 MED ORDER — LIDOCAINE HCL 1 % IJ SOLN
INTRAMUSCULAR | Status: AC
  Filled 2024-01-31: qty 20

## 2024-01-31 MED ORDER — KETOROLAC TROMETHAMINE 30 MG/ML IJ SOLN
INTRAMUSCULAR | Status: DC | PRN
  Administered 2024-01-31: 30 mg via INTRAVENOUS

## 2024-01-31 MED ORDER — CHLORHEXIDINE GLUCONATE 0.12 % MT SOLN
15.0000 mL | Freq: Once | OROMUCOSAL | Status: AC
  Administered 2024-01-31: 15 mL via OROMUCOSAL

## 2024-01-31 MED ORDER — MIDAZOLAM HCL 2 MG/2ML IJ SOLN
INTRAMUSCULAR | Status: AC
Start: 2024-01-31 — End: 2024-01-31
  Filled 2024-01-31: qty 2

## 2024-01-31 MED ORDER — MIDAZOLAM HCL 2 MG/2ML IJ SOLN
INTRAMUSCULAR | Status: DC | PRN
  Administered 2024-01-31: 2 mg via INTRAVENOUS

## 2024-01-31 MED ORDER — PROPOFOL 500 MG/50ML IV EMUL
INTRAVENOUS | Status: DC | PRN
  Administered 2024-01-31: 30 mg via INTRAVENOUS
  Administered 2024-01-31: 50 mg via INTRAVENOUS
  Administered 2024-01-31: 150 ug/kg/min via INTRAVENOUS

## 2024-01-31 MED ORDER — SUCCINYLCHOLINE CHLORIDE 200 MG/10ML IV SOSY
PREFILLED_SYRINGE | INTRAVENOUS | Status: DC | PRN
Start: 2024-01-31 — End: 2024-01-31
  Administered 2024-01-31: 160 mg via INTRAVENOUS

## 2024-01-31 MED ORDER — SODIUM CHLORIDE 0.9 % IR SOLN
Status: DC | PRN
  Administered 2024-01-31: 3000 mL

## 2024-01-31 MED ORDER — CELECOXIB 200 MG PO CAPS
400.0000 mg | ORAL_CAPSULE | ORAL | Status: AC
  Administered 2024-01-31: 400 mg via ORAL

## 2024-01-31 MED ORDER — FENTANYL CITRATE (PF) 250 MCG/5ML IJ SOLN
INTRAMUSCULAR | Status: AC
  Filled 2024-01-31: qty 5

## 2024-01-31 SURGICAL SUPPLY — 16 items
DEVICE MYOSURE LITE (MISCELLANEOUS) IMPLANT
DRSG TEGADERM 4X10 (GAUZE/BANDAGES/DRESSINGS) IMPLANT
GAUZE 4X4 16PLY ~~LOC~~+RFID DBL (SPONGE) IMPLANT
GLOVE BIO SURGEON STRL SZ7 (GLOVE) ×2 IMPLANT
GLOVE BIOGEL PI IND STRL 7.0 (GLOVE) ×2 IMPLANT
GOWN STRL REUS W/ TWL XL LVL3 (GOWN DISPOSABLE) ×2 IMPLANT
HIBICLENS CHG 4% 4OZ BTL (MISCELLANEOUS) ×2 IMPLANT
KIT PROCEDURE FLUENT (KITS) ×2 IMPLANT
KIT TURNOVER KIT B (KITS) ×2 IMPLANT
NDL SPNL 22GX3.5 QUINCKE BK (NEEDLE) IMPLANT
NEEDLE SPNL 22GX3.5 QUINCKE BK (NEEDLE) ×2 IMPLANT
PACK VAGINAL MINOR WOMEN LF (CUSTOM PROCEDURE TRAY) ×2 IMPLANT
PAD OB MATERNITY 11 LF (PERSONAL CARE ITEMS) ×2 IMPLANT
PAD PREP 24X48 CUFFED NSTRL (MISCELLANEOUS) ×2 IMPLANT
SEAL ROD LENS SCOPE MYOSURE (ABLATOR) ×2 IMPLANT
SOL .9 NS 3000ML IRR UROMATIC (IV SOLUTION) ×2 IMPLANT

## 2024-01-31 NOTE — Anesthesia Postprocedure Evaluation (Signed)
 Anesthesia Post Note  Patient: Estelle VAQUERA-CARDOZA  Procedure(s) Performed: DILATATION & CURETTAGE/HYSTEROSCOPY MYOSURE RESECTION     Patient location during evaluation: PACU Anesthesia Type: General Level of consciousness: awake and alert Pain management: pain level controlled Vital Signs Assessment: post-procedure vital signs reviewed and stable Respiratory status: spontaneous breathing, nonlabored ventilation, respiratory function stable and patient connected to nasal cannula oxygen Cardiovascular status: blood pressure returned to baseline and stable Postop Assessment: no apparent nausea or vomiting Anesthetic complications: no   No notable events documented.  Last Vitals:  Vitals:   01/31/24 1130 01/31/24 1207  BP: 107/63 126/73  Pulse: 65 65  Resp: 11 18  Temp: 36.5 C 36.5 C  SpO2: 100% 100%    Last Pain:  Vitals:   01/31/24 1207  TempSrc:   PainSc: 0-No pain                 Thom JONELLE Peoples

## 2024-01-31 NOTE — Anesthesia Procedure Notes (Addendum)
 Procedure Name: MAC Date/Time: 01/31/2024 10:12 AM  Performed by: Viviana Almarie DASEN, CRNAPre-anesthesia Checklist: Patient identified, Suction available, Patient being monitored, Emergency Drugs available and Timeout performed Patient Re-evaluated:Patient Re-evaluated prior to induction Oxygen Delivery Method: Nasal cannula Induction Type: IV induction Airway Equipment and Method: Oral airway

## 2024-01-31 NOTE — Transfer of Care (Signed)
 Immediate Anesthesia Transfer of Care Note  Patient: Caitriona VAQUERA-CARDOZA  Procedure(s) Performed: DILATATION & CURETTAGE/HYSTEROSCOPY MYOSURE RESECTION  Patient Location: PACU  Anesthesia Type:General  Level of Consciousness: awake, alert , oriented, and patient cooperative  Airway & Oxygen Therapy: Patient Spontanous Breathing and Patient connected to nasal cannula oxygen  Post-op Assessment: Report given to RN, Post -op Vital signs reviewed and stable, Patient moving all extremities X 4, and Patient able to stick tongue midline  Post vital signs: Reviewed and stable  Last Vitals:  Vitals Value Taken Time  BP 110/51   Temp 36.6 C 01/31/24 10:55  Pulse 76 01/31/24 10:56  Resp 20 01/31/24 10:56  SpO2 95 % 01/31/24 10:56  Vitals shown include unfiled device data.  Last Pain:  Vitals:   01/31/24 0915  TempSrc: Oral  PainSc: 0-No pain      Patients Stated Pain Goal: 4 (01/31/24 0915)  Complications: No notable events documented.

## 2024-01-31 NOTE — Progress Notes (Signed)
 Pt awake, alert, oriented x4. VSS. Tolerating fluid and crackers. Pain 0/10. No nausea. Able to ambulate to restroom without difficulty. Void x1.

## 2024-01-31 NOTE — Op Note (Signed)
 01/31/24  Cindie VAQUERA-CARDOZA [id number]  OPERATIVE REPORT  Preop Diagnosis: Pre-Op Diagnosis Codes:     * Endocervical polyp [N84.1]   Post operative diagnosis: same  Procedure: Procedure(s): HYSTEROSCOPY, POLYPECTOMY with MYOSURE RESECTION   Surgeon: Vera LULLA Pa, MD Assistant: none  Anesthesia: MAC converted to GETA Fluids: please see anesthesia report Fluid deficit: 70cc  Complications: None  Findings: Normal cervix. Uterine cavity measuring 7cm with both ostia seen. 1 endometrial polyp was noted. Anterior and posterior ridges of the uterine cavity were noted.  Estimated blood loss: Minimal  Specimens:  ID Type Source Tests Collected by Time Destination  1 : Endometrial polyp and biopsy Tissue PATH Gyn benign resection SURGICAL PATHOLOGY (Canceled) Pa Vera LULLA, MD 01/31/2024 1037     Disposition of specimen: Pathology    Procedure: Patient was taken to the OR where she was placed in dorsal lithotomy in stirrups. She voided prior to transport. SCDs were in place.  The patient was prepped and draped in the usual sterile fashion. An adequate timeout was obtained and everyone agreed. A bivalve speculum was placed inside the vagina and the cervix visualized. The cervix was grasped anteriorly with a single-tooth tenaculum. Paracervical block was performed with 1% lidocaine .  Patient had a coughing fit with transient hypoxia requiring endotracheal intubation. The anesthesiologist presented for uncomplicated induction of general anesthesia with endotracheal intubation.  I proceeded with the procedure at this time.  The uterus sounded to 7 cm. Sequential cervical dilation was done to 75fr, and the myosure hysteroscope was introduced into the uterine cavity.  Findings as noted. Endometrial polyp were seen and completely morcellated using Myosure. Ridges noted within endometrium was also shaved down to be flush with endometrium. Curettage was not performed. All instrument,  sponge and lap counts were correct x2. The patient was awakened taken to recovery room in stable condition.  Vera LULLA Pa, MD 01/31/24  10:47 AM

## 2024-01-31 NOTE — Anesthesia Procedure Notes (Addendum)
 Procedure Name: Intubation Date/Time: 01/31/2024 10:28 AM  Performed by: Viviana Almarie DASEN, CRNAPre-anesthesia Checklist: Patient identified, Emergency Drugs available, Suction available and Patient being monitored Patient Re-evaluated:Patient Re-evaluated prior to induction Oxygen Delivery Method: Circle System Utilized Preoxygenation: Pre-oxygenation with 100% oxygen Induction Type: IV induction Ventilation: Two handed mask ventilation required Laryngoscope Size: Miller and 3 Grade View: Grade I Tube type: Oral Tube size: 7.0 mm Number of attempts: 1 Airway Equipment and Method: Bite block and Oral airway Placement Confirmation: positive ETCO2 Secured at: 23 cm Tube secured with: Tape Dental Injury: Teeth and Oropharynx as per pre-operative assessment

## 2024-01-31 NOTE — Discharge Instructions (Addendum)
  Post Anesthesia Home Care Instructions  Activity: Get plenty of rest for the remainder of the day. A responsible individual must stay with you for 24 hours following the procedure.  For the next 24 hours, DO NOT: -Drive a car -Advertising copywriter -Drink alcoholic beverages -Take any medication unless instructed by your physician -Make any legal decisions or sign important papers.  Meals: Start with liquid foods such as gelatin or soup. Progress to regular foods as tolerated. Avoid greasy, spicy, heavy foods. If nausea and/or vomiting occur, drink only clear liquids until the nausea and/or vomiting subsides. Call your physician if vomiting continues.  Special Instructions/Symptoms: Your throat may feel dry or sore from the anesthesia or the breathing tube placed in your throat during surgery. If this causes discomfort, gargle with warm salt water. The discomfort should disappear within 24 hours.     No ibuprofen , Advil , Aleve, Motrin , ketorolac , meloxicam, naproxen, or other NSAIDS until after 445 pm today if needed. No acetaminophen /Tylenol  until after 320 pm today if needed.

## 2024-01-31 NOTE — Interval H&P Note (Signed)
 Date of Initial H&P: 01/20/24  History reviewed, patient examined, no change in status, stable for surgery.

## 2024-01-31 NOTE — Anesthesia Preprocedure Evaluation (Addendum)
 Anesthesia Evaluation  Patient identified by MRN, date of birth, ID band Patient awake    Reviewed: Allergy & Precautions, H&P , NPO status , Patient's Chart, lab work & pertinent test results  Airway Mallampati: II  TM Distance: >3 FB Neck ROM: Full    Dental no notable dental hx.    Pulmonary neg pulmonary ROS   Pulmonary exam normal breath sounds clear to auscultation       Cardiovascular negative cardio ROS Normal cardiovascular exam Rhythm:Regular Rate:Normal     Neuro/Psych negative neurological ROS  negative psych ROS   GI/Hepatic negative GI ROS, Neg liver ROS,,,  Endo/Other  diabetes  Prolactinoma PCOS  Renal/GU negative Renal ROS  negative genitourinary   Musculoskeletal negative musculoskeletal ROS (+)    Abdominal   Peds negative pediatric ROS (+)  Hematology negative hematology ROS (+)   Anesthesia Other Findings   Reproductive/Obstetrics negative OB ROS                              Anesthesia Physical Anesthesia Plan  ASA: 2  Anesthesia Plan: General   Post-op Pain Management:    Induction: Intravenous  PONV Risk Score and Plan: 3 and Ondansetron , Dexamethasone  and Midazolam   Airway Management Planned: LMA  Additional Equipment:   Intra-op Plan:   Post-operative Plan: Extubation in OR  Informed Consent: I have reviewed the patients History and Physical, chart, labs and discussed the procedure including the risks, benefits and alternatives for the proposed anesthesia with the patient or authorized representative who has indicated his/her understanding and acceptance.     Dental advisory given  Plan Discussed with: CRNA  Anesthesia Plan Comments:         Anesthesia Quick Evaluation

## 2024-02-01 ENCOUNTER — Encounter (HOSPITAL_COMMUNITY): Payer: Self-pay | Admitting: Obstetrics and Gynecology

## 2024-02-03 LAB — SURGICAL PATHOLOGY

## 2024-02-07 ENCOUNTER — Ambulatory Visit: Payer: Self-pay | Admitting: Obstetrics and Gynecology

## 2024-02-13 ENCOUNTER — Ambulatory Visit (INDEPENDENT_AMBULATORY_CARE_PROVIDER_SITE_OTHER): Admitting: Obstetrics and Gynecology

## 2024-02-13 ENCOUNTER — Encounter: Payer: Self-pay | Admitting: Obstetrics and Gynecology

## 2024-02-13 VITALS — BP 104/60 | HR 84 | Temp 98.0°F | Wt 278.0 lb

## 2024-02-13 DIAGNOSIS — Z09 Encounter for follow-up examination after completed treatment for conditions other than malignant neoplasm: Secondary | ICD-10-CM

## 2024-02-13 NOTE — Progress Notes (Signed)
 28 y.o. G75P0010 female with infertility, PCOS, hyperprolactinemia/pituitary microadenoma, and elevated DHEA-S (followed by endocrine), recent type 2 diabetes, morbid obesity here for 2wk postop visit: dx hyst, D&C, polypectomy scheduled 01/31/24. 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  Patient's last menstrual period was 01/25/2024.   01/31/24 pathology: A. ENDOMETRIAL POLYP, POLYPECTOMY AND BIOPSY:  - Polypoid fragments of proliferative endometrium with ciliated change.  - Negative for atypia/EIN and malignancy.   Pt states she is doing well. Denies N/V, abdominal pain, VB, dysuria. Normal BM and voids.  Failed trigger point cycle by CFI, recommend weight loss with goal: BMI <40 and then f/u Discussed goal weight ~250lb. Walking now. No strength training  GYN HISTORY: Miscarriage at 28yo, spontaneous Chlamydia, 2024 PCOS Pituitary microadenoma, hyperprolactinemia Elevated DHEA  OB History  Gravida Para Term Preterm AB Living  1 0 0 0 1 0  SAB IAB Ectopic Multiple Live Births  1 0 0 0 0    # Outcome Date GA Lbr Len/2nd Weight Sex Type Anes PTL Lv  1 SAB             Past Medical History:  Diagnosis Date   Anemia    [employer]ia 12/22/2019   Diabetes mellitus without complication (HCC)    per pt pre-dm now 01/31/24   Prolactinoma (HCC) 2025    Past Surgical History:  Procedure Laterality Date   DILATATION & CURRETTAGE/HYSTEROSCOPY WITH RESECTOCOPE N/A 01/31/2024   Procedure: DILATATION & CURETTAGE/HYSTEROSCOPY;  Surgeon: Dallie Vera GAILS, MD;  Location: [facility] OR;  Service: Gynecology;  Laterality: N/A;   MICRODISCECTOMY LUMBAR  08/2018   MYOSURE RESECTION  01/31/2024   Procedure: MELINDA RESECTION;  Surgeon: Dallie Vera GAILS, MD;  Location: [facility] OR;  Service: Gynecology;;   SPINE SURGERY N/A    [employer]ia 12/22/2019   WISDOM TOOTH EXTRACTION      Current Outpatient Medications on File Prior to Visit  Medication Sig Dispense Refill   Cholecalciferol  (VITAMIN D3) 1.25  MG (50000 UT) CAPS Take 1 capsule (50,000 Units total) by mouth once a week. 8 capsule 0   metFORMIN  (GLUCOPHAGE ) 1000 MG tablet 1 tablet with a meal Orally Once a day     Omega 3-6-9 CAPS as directed Orally twice a day     OZEMPIC, 0.25 OR 0.5 MG/DOSE, 2 MG/3ML SOPN INJECT 0.5MG  SUBCUTANEOUSLY ONCE A WEEK; Duration: 28 days     Prenatal Vit-Fe Fumarate-FA (MULTIVITAMIN-PRENATAL) 27-0.8 MG TABS tablet Take 1 tablet by mouth daily at 12 noon.     No current facility-administered medications on file prior to visit.    No Known Allergies    PE Today's Vitals   02/13/24 0859  BP: 104/60  Pulse: 84  Temp: 98 F (36.7 C)  TempSrc: Oral  SpO2: 98%  Weight: 278 lb (126.1 kg)   Body mass index is 43.87 kg/m.  Physical Exam Vitals reviewed.  Constitutional:      General: She is not in acute distress.    Appearance: Normal appearance.  HENT:     Head: Normocephalic and atraumatic.     Nose: Nose normal.  Eyes:     Extraocular Movements: Extraocular movements intact.     Conjunctiva/sclera: Conjunctivae normal.  Pulmonary:     Effort: Pulmonary effort is normal.  Abdominal:     General: There is no distension.     Palpations: Abdomen is soft.     Tenderness: There is no abdominal tenderness.  Musculoskeletal:        General: Normal range of  motion.     Cervical back: Normal range of motion.  Neurological:     General: No focal deficit present.     Mental Status: She is alert.  Psychiatric:        Mood and Affect: Mood normal.        Behavior: Behavior normal.      Assessment and Plan:        Postop check  Normal postop exam.  Pathology reviewed with patient. Cleared to return to work and exercise Recommend strength training 3d/wk in addition to walking All questions answered.   RTO in 3 months for annual and infertility and weight loss f/u  Vera LULLA Pa, MD

## 2024-04-01 ENCOUNTER — Other Ambulatory Visit (HOSPITAL_COMMUNITY): Payer: Self-pay

## 2024-05-12 ENCOUNTER — Other Ambulatory Visit (HOSPITAL_COMMUNITY): Payer: Self-pay | Admitting: Physician Assistant

## 2024-05-12 DIAGNOSIS — E611 Iron deficiency: Secondary | ICD-10-CM | POA: Insufficient documentation

## 2024-05-16 ENCOUNTER — Ambulatory Visit (HOSPITAL_COMMUNITY)
Admission: EM | Admit: 2024-05-16 | Discharge: 2024-05-16 | Disposition: A | Discharge status: Home / Self Care | Attending: Family Medicine | Admitting: Family Medicine

## 2024-05-16 ENCOUNTER — Encounter (HOSPITAL_COMMUNITY): Payer: Self-pay | Admitting: *Deleted

## 2024-05-16 ENCOUNTER — Ambulatory Visit (INDEPENDENT_AMBULATORY_CARE_PROVIDER_SITE_OTHER)

## 2024-05-16 DIAGNOSIS — S6992XA Unspecified injury of left wrist, hand and finger(s), initial encounter: Secondary | ICD-10-CM

## 2024-05-16 DIAGNOSIS — M79642 Pain in left hand: Secondary | ICD-10-CM | POA: Diagnosis not present

## 2024-05-16 NOTE — Discharge Instructions (Signed)
 Wear the thumb spica throughout the day to help provide support and compression to the thumb.  At night I suggest doing range of motion with the thumb to help strengthen the ligaments and tendons.  This can be as simple as moving it from finger to finger and extending up.  Take ibuprofen  800 mg every 8 hours to help with any pain and swelling.  If your symptoms do not improve over the next week please follow-up with sports medicine for further evaluation.  Return to clinic for any new or urgent symptoms.
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
## 2024-05-18 ENCOUNTER — Telehealth (HOSPITAL_COMMUNITY): Payer: Self-pay

## 2024-05-18 NOTE — Telephone Encounter (Signed)
 Auth Submission: NO AUTH NEEDED Site of care: Site of care: [facility] Payer: [employer] Medication & CPT/J Code(s) submitted: Venofer (Iron Sucrose) J1756 Diagnosis Code: E61.1 Route of submission (phone, fax, portal): portal Phone # Fax # Auth type: Buy/Bill HB Units/visits requested: 300mg  x 3 doses Reference number: [id number] Approval from: 05/18/24 to 08/16/24

## 2024-05-26 ENCOUNTER — Encounter (HOSPITAL_COMMUNITY)
Admission: RE | Admit: 2024-05-26 | Discharge: 2024-05-26 | Disposition: A | Discharge status: Home / Self Care | Source: Ambulatory Visit | Attending: Physician Assistant | Admitting: Physician Assistant

## 2024-05-26 VITALS — BP 119/67 | HR 88 | Temp 98.1°F | Resp 16

## 2024-05-26 DIAGNOSIS — E611 Iron deficiency: Secondary | ICD-10-CM | POA: Insufficient documentation

## 2024-05-26 MED ORDER — IRON SUCROSE 300 MG IVPB - SIMPLE MED
Status: AC
  Filled 2024-05-26: qty 265

## 2024-05-26 MED ORDER — IRON SUCROSE 300 MG IVPB - SIMPLE MED
300.0000 mg | Freq: Once | Status: AC
  Administered 2024-05-26: 300 mg via INTRAVENOUS

## 2024-06-02 ENCOUNTER — Ambulatory Visit (HOSPITAL_COMMUNITY)
Admission: RE | Admit: 2024-06-02 | Discharge: 2024-06-02 | Disposition: A | Discharge status: Home / Self Care | Source: Ambulatory Visit | Attending: Physician Assistant | Admitting: Physician Assistant

## 2024-06-02 VITALS — BP 126/93 | HR 81 | Temp 98.0°F | Resp 15

## 2024-06-02 DIAGNOSIS — E611 Iron deficiency: Secondary | ICD-10-CM | POA: Diagnosis present

## 2024-06-02 MED ORDER — IRON SUCROSE 300 MG IVPB - SIMPLE MED
300.0000 mg | Freq: Once | Status: AC
  Administered 2024-06-02: 300 mg via INTRAVENOUS

## 2024-06-02 MED ORDER — IRON SUCROSE 300 MG IVPB - SIMPLE MED
Status: AC
  Filled 2024-06-02: qty 265

## 2024-06-09 ENCOUNTER — Inpatient Hospital Stay (HOSPITAL_COMMUNITY)
Admission: RE | Admit: 2024-06-09 | Discharge: 2024-06-09 | Disposition: A | Source: Ambulatory Visit | Attending: Physician Assistant | Admitting: Physician Assistant

## 2024-06-09 VITALS — BP 121/91 | HR 75 | Temp 98.0°F

## 2024-06-09 DIAGNOSIS — E611 Iron deficiency: Secondary | ICD-10-CM

## 2024-06-09 MED ORDER — IRON SUCROSE 300 MG IVPB - SIMPLE MED
300.0000 mg | Freq: Once | Status: AC
  Administered 2024-06-09: 300 mg via INTRAVENOUS

## 2024-06-09 MED ORDER — IRON SUCROSE 300 MG IVPB - SIMPLE MED
Status: AC
  Filled 2024-06-09: qty 265
